# Patient Record
Sex: Female | Born: 1970 | Race: White | Hispanic: No | Smoking: Never smoker
Health system: Southern US, Community
[De-identification: ages and names within clinical notes are randomized; demographics above are authoritative.]

## PROBLEM LIST (undated history)

## (undated) DIAGNOSIS — F419 Anxiety disorder, unspecified: Secondary | ICD-10-CM

## (undated) DIAGNOSIS — E785 Hyperlipidemia, unspecified: Secondary | ICD-10-CM

## (undated) DIAGNOSIS — I10 Essential (primary) hypertension: Secondary | ICD-10-CM

## (undated) DIAGNOSIS — Z800 Family history of malignant neoplasm of digestive organs: Secondary | ICD-10-CM

## (undated) DIAGNOSIS — M1990 Unspecified osteoarthritis, unspecified site: Secondary | ICD-10-CM

## (undated) DIAGNOSIS — J45909 Unspecified asthma, uncomplicated: Secondary | ICD-10-CM

## (undated) DIAGNOSIS — C801 Malignant (primary) neoplasm, unspecified: Secondary | ICD-10-CM

## (undated) DIAGNOSIS — Z9189 Other specified personal risk factors, not elsewhere classified: Secondary | ICD-10-CM

## (undated) DIAGNOSIS — Z803 Family history of malignant neoplasm of breast: Secondary | ICD-10-CM

## (undated) DIAGNOSIS — Z808 Family history of malignant neoplasm of other organs or systems: Secondary | ICD-10-CM

## (undated) DIAGNOSIS — F32A Depression, unspecified: Secondary | ICD-10-CM

## (undated) HISTORY — DX: Family history of malignant neoplasm of breast: Z80.3

## (undated) HISTORY — PX: WISDOM TOOTH EXTRACTION: SHX21

## (undated) HISTORY — DX: Anxiety disorder, unspecified: F41.9

## (undated) HISTORY — DX: Hyperlipidemia, unspecified: E78.5

## (undated) HISTORY — DX: Family history of malignant neoplasm of other organs or systems: Z80.8

## (undated) HISTORY — DX: Family history of malignant neoplasm of digestive organs: Z80.0

---

## 1998-08-10 ENCOUNTER — Emergency Department (HOSPITAL_COMMUNITY): Admission: EM | Admit: 1998-08-10 | Discharge: 1998-08-10 | Payer: Self-pay | Admitting: Emergency Medicine

## 2000-08-21 ENCOUNTER — Encounter: Payer: Self-pay | Admitting: Emergency Medicine

## 2000-08-21 ENCOUNTER — Emergency Department (HOSPITAL_COMMUNITY): Admission: EM | Admit: 2000-08-21 | Discharge: 2000-08-21 | Payer: Self-pay | Admitting: Emergency Medicine

## 2001-11-23 ENCOUNTER — Emergency Department (HOSPITAL_COMMUNITY): Admission: EM | Admit: 2001-11-23 | Discharge: 2001-11-23 | Payer: Self-pay | Admitting: Emergency Medicine

## 2002-01-26 ENCOUNTER — Emergency Department (HOSPITAL_COMMUNITY): Admission: EM | Admit: 2002-01-26 | Discharge: 2002-01-26 | Payer: Self-pay | Admitting: Emergency Medicine

## 2002-02-05 ENCOUNTER — Emergency Department (HOSPITAL_COMMUNITY): Admission: EM | Admit: 2002-02-05 | Discharge: 2002-02-05 | Payer: Self-pay | Admitting: Emergency Medicine

## 2003-06-25 ENCOUNTER — Emergency Department (HOSPITAL_COMMUNITY): Admission: EM | Admit: 2003-06-25 | Discharge: 2003-06-25 | Payer: Self-pay | Admitting: Emergency Medicine

## 2003-12-20 ENCOUNTER — Other Ambulatory Visit: Admission: RE | Admit: 2003-12-20 | Discharge: 2003-12-20 | Payer: Self-pay | Admitting: Gynecology

## 2004-04-09 ENCOUNTER — Emergency Department (HOSPITAL_COMMUNITY): Admission: EM | Admit: 2004-04-09 | Discharge: 2004-04-09 | Payer: Self-pay | Admitting: Emergency Medicine

## 2006-01-13 ENCOUNTER — Ambulatory Visit: Payer: Self-pay | Admitting: Family Medicine

## 2006-01-15 ENCOUNTER — Encounter: Admission: RE | Admit: 2006-01-15 | Discharge: 2006-01-15 | Payer: Self-pay | Admitting: Family Medicine

## 2009-10-28 ENCOUNTER — Emergency Department (HOSPITAL_COMMUNITY): Admission: EM | Admit: 2009-10-28 | Discharge: 2009-10-28 | Payer: Self-pay | Admitting: Family Medicine

## 2010-03-23 ENCOUNTER — Ambulatory Visit: Payer: Self-pay | Admitting: Family Medicine

## 2010-04-17 ENCOUNTER — Ambulatory Visit: Payer: Self-pay | Admitting: Family Medicine

## 2010-05-21 ENCOUNTER — Ambulatory Visit: Payer: Self-pay | Admitting: Family Medicine

## 2011-04-24 ENCOUNTER — Encounter: Payer: Self-pay | Admitting: Family Medicine

## 2011-08-27 ENCOUNTER — Other Ambulatory Visit: Payer: Self-pay | Admitting: Family Medicine

## 2011-08-27 DIAGNOSIS — Z1231 Encounter for screening mammogram for malignant neoplasm of breast: Secondary | ICD-10-CM

## 2011-09-19 ENCOUNTER — Ambulatory Visit (HOSPITAL_COMMUNITY): Payer: Self-pay | Attending: Family Medicine

## 2011-12-24 ENCOUNTER — Encounter (HOSPITAL_COMMUNITY): Payer: Self-pay | Admitting: *Deleted

## 2011-12-24 ENCOUNTER — Emergency Department (HOSPITAL_COMMUNITY)
Admission: EM | Admit: 2011-12-24 | Discharge: 2011-12-24 | Disposition: A | Discharge status: Home / Self Care | Payer: Self-pay | Attending: Emergency Medicine | Admitting: Emergency Medicine

## 2011-12-24 ENCOUNTER — Emergency Department (HOSPITAL_COMMUNITY): Payer: Self-pay

## 2011-12-24 DIAGNOSIS — M79609 Pain in unspecified limb: Secondary | ICD-10-CM | POA: Insufficient documentation

## 2011-12-24 DIAGNOSIS — E785 Hyperlipidemia, unspecified: Secondary | ICD-10-CM | POA: Insufficient documentation

## 2011-12-24 DIAGNOSIS — M79603 Pain in arm, unspecified: Secondary | ICD-10-CM

## 2011-12-24 MED ORDER — IBUPROFEN 800 MG PO TABS
800.0000 mg | ORAL_TABLET | Freq: Once | ORAL | Status: AC
  Administered 2011-12-24: 800 mg via ORAL
  Filled 2011-12-24: qty 1

## 2011-12-24 MED ORDER — CYCLOBENZAPRINE HCL 10 MG PO TABS: 10.0000 mg | ORAL_TABLET | Freq: Two times a day (BID) | ORAL | Status: AC | PRN

## 2011-12-24 MED ORDER — OXYCODONE-ACETAMINOPHEN 5-325 MG PO TABS: 2.0000 | ORAL_TABLET | ORAL | Status: AC | PRN

## 2011-12-24 MED ORDER — IBUPROFEN 800 MG PO TABS: 800.0000 mg | ORAL_TABLET | Freq: Three times a day (TID) | ORAL | Status: AC

## 2011-12-24 NOTE — Discharge Instructions (Signed)
Vanessa Carr the x-ray of your arm shows no fracture or other acute process for your pain.  Follow up with the pcp of your choice from the list below or the orthopedic listed below.  Return to the ER for severe pain or weakness in the extremity.  Do not drive with the narcotic pain meds or muscle relaxor.

## 2011-12-24 NOTE — Progress Notes (Signed)
Pt states she plans to enroll in insurance offered by her employer soon

## 2011-12-24 NOTE — ED Notes (Signed)
Pt c/o R upper arm pain x2 weeks. Tingling to R fingers intermittently. Denies known injury.

## 2011-12-24 NOTE — Progress Notes (Signed)
Pt listed as self pay with no insurance coverage Pt confirms she is self pay [place] resident.  CM and [facility] community liaison spoke with her Pt offered [facility] services to assist with finding a [place] self pay provider  

## 2011-12-24 NOTE — ED Provider Notes (Signed)
History     CSN: [id number]  Arrival date & time 12/24/11  1024   First MD Initiated Contact with Patient 12/24/11 1207      Chief Complaint  Patient presents with  . Arm Pain    (Consider location/radiation/quality/duration/timing/severity/associated sxs/prior treatment) Patient is a 41 y.o. female presenting with arm injury. The history is provided by the patient. No language interpreter was used.  Arm Injury  The incident occurred more than 2 days ago. The injury mechanism was a pulled muscle. There is an injury to the right upper arm. The pain is moderate. It is unlikely that a foreign body is present. Associated symptoms include tingling. Pertinent negatives include no chest pain, no numbness, no nausea, no vomiting, no neck pain, no focal weakness, no light-headedness, no loss of consciousness, no seizures, no weakness and no memory loss. There have been no prior injuries to these areas. She is right-handed.   Pt c/o R humerus pain x 2 weeks after cleaning extensively.  Also states tingleing in fingers on the R.   Past Medical History  Diagnosis Date  . Anxiety   . Dyslipidemia     History reviewed. No pertinent past surgical history.  Family History  Problem Relation Age of Onset  . Hypertension Mother   . Asthma Father     History  Substance Use Topics  . Smoking status: Never Smoker   . Smokeless tobacco: Not on file  . Alcohol Use: Yes     socially    OB History    Grav Para Term Preterm Abortions TAB SAB Ect Mult Living                  Review of Systems  HENT: Negative for neck pain.   Cardiovascular: Negative for chest pain.  Gastrointestinal: Negative for nausea and vomiting.  Neurological: Positive for tingling. Negative for focal weakness, seizures, loss of consciousness, weakness, light-headedness and numbness.  Psychiatric/Behavioral: Negative for memory loss.    Allergies  Review of patient's allergies indicates no known allergies.  Home  Medications   Current Outpatient Rx  Name Route Sig Dispense Refill  . LEVONORGESTREL 20 MCG/24HR IU IUD Intrauterine 1 each by Intrauterine route once.      BP 136/76  Pulse 87  Temp 97.9 F (36.6 C) (Oral)  Resp 20  Ht 5\' 9"  (1.753 m)  Wt 240 lb (108.863 kg)  BMI 35.44 kg/m2  SpO2 99%  Physical Exam  Nursing note and vitals reviewed. Constitutional: She is oriented to person, place, and time. She appears well-developed and well-nourished.  HENT:  Head: Normocephalic and atraumatic.  Eyes: Conjunctivae and EOM are normal. Pupils are equal, round, and reactive to light.  Neck: Normal range of motion. Neck supple.  Cardiovascular: Normal rate, regular rhythm, normal heart sounds and intact distal pulses.  Exam reveals no gallop and no friction rub.   No murmur heard. Pulmonary/Chest: Effort normal and breath sounds normal.  Abdominal: Soft. Bowel sounds are normal.  Musculoskeletal: Normal range of motion. She exhibits tenderness. She exhibits no edema.       R humerous tenderness  Neurological: She is alert and oriented to person, place, and time. She has normal reflexes.  Skin: Skin is warm and dry.  Psychiatric: She has a normal mood and affect.    ED Course  Procedures (including critical care time)  Labs Reviewed - No data to display No results found.   No diagnosis found.    MDM  RUE  tenderness with full rom +cms.  X-ray unremarkable.  Follow up with pcp.  Return if worse. Sling, flexeril and percocetrx.  Ibuprofen.        Remi Haggard, NP 12/27/11 1719

## 2011-12-27 NOTE — ED Provider Notes (Signed)
Medical screening examination/treatment/procedure(s) were performed by non-physician practitioner and as supervising physician I was immediately available for consultation/collaboration.   Lyanne Co, MD 12/27/11 1745

## 2012-05-31 ENCOUNTER — Encounter (HOSPITAL_COMMUNITY): Payer: Self-pay | Admitting: *Deleted

## 2012-05-31 ENCOUNTER — Emergency Department (HOSPITAL_COMMUNITY): Payer: Self-pay

## 2012-05-31 ENCOUNTER — Inpatient Hospital Stay (HOSPITAL_COMMUNITY)
Admission: EM | Admit: 2012-05-31 | Discharge: 2012-06-03 | DRG: 690 | Disposition: A | Discharge status: Home / Self Care | Payer: MEDICAID | Attending: Internal Medicine | Admitting: Internal Medicine

## 2012-05-31 DIAGNOSIS — F411 Generalized anxiety disorder: Secondary | ICD-10-CM | POA: Diagnosis present

## 2012-05-31 DIAGNOSIS — R748 Abnormal levels of other serum enzymes: Secondary | ICD-10-CM

## 2012-05-31 DIAGNOSIS — E876 Hypokalemia: Secondary | ICD-10-CM | POA: Diagnosis present

## 2012-05-31 DIAGNOSIS — N12 Tubulo-interstitial nephritis, not specified as acute or chronic: Secondary | ICD-10-CM | POA: Diagnosis present

## 2012-05-31 DIAGNOSIS — Z79899 Other long term (current) drug therapy: Secondary | ICD-10-CM

## 2012-05-31 DIAGNOSIS — F329 Major depressive disorder, single episode, unspecified: Secondary | ICD-10-CM | POA: Diagnosis present

## 2012-05-31 DIAGNOSIS — B9689 Other specified bacterial agents as the cause of diseases classified elsewhere: Secondary | ICD-10-CM | POA: Diagnosis present

## 2012-05-31 DIAGNOSIS — F3289 Other specified depressive episodes: Secondary | ICD-10-CM | POA: Diagnosis present

## 2012-05-31 DIAGNOSIS — R7402 Elevation of levels of lactic acid dehydrogenase (LDH): Secondary | ICD-10-CM | POA: Diagnosis present

## 2012-05-31 DIAGNOSIS — R7401 Elevation of levels of liver transaminase levels: Secondary | ICD-10-CM | POA: Diagnosis present

## 2012-05-31 LAB — CBC WITH DIFFERENTIAL/PLATELET
Basophils Absolute: 0 10*3/uL (ref 0.0–0.1)
Basophils Relative: 0 % (ref 0–1)
Eosinophils Absolute: 0 10*3/uL (ref 0.0–0.7)
Eosinophils Relative: 0 % (ref 0–5)
HCT: 38.5 % (ref 36.0–46.0)
Hemoglobin: 13.2 g/dL (ref 12.0–15.0)
Lymphocytes Relative: 12 % (ref 12–46)
Lymphs Abs: 1.2 10*3/uL (ref 0.7–4.0)
MCH: 28.5 pg (ref 26.0–34.0)
MCHC: 34.3 g/dL (ref 30.0–36.0)
MCV: 83.2 fL (ref 78.0–100.0)
Monocytes Absolute: 1.2 10*3/uL — ABNORMAL HIGH (ref 0.1–1.0)
Monocytes Relative: 12 % (ref 3–12)
Neutro Abs: 7.5 10*3/uL (ref 1.7–7.7)
Neutrophils Relative %: 76 % (ref 43–77)
Platelets: 188 10*3/uL (ref 150–400)
RBC: 4.63 MIL/uL (ref 3.87–5.11)
RDW: 12.6 % (ref 11.5–15.5)
WBC: 9.9 10*3/uL (ref 4.0–10.5)

## 2012-05-31 LAB — LIPASE, BLOOD: Lipase: 15 U/L (ref 11–59)

## 2012-05-31 LAB — BASIC METABOLIC PANEL
BUN: 7 mg/dL (ref 6–23)
CO2: 25 mEq/L (ref 19–32)
Calcium: 9 mg/dL (ref 8.4–10.5)
Chloride: 96 mEq/L (ref 96–112)
Creatinine, Ser: 0.7 mg/dL (ref 0.50–1.10)
GFR calc Af Amer: >90 mL/min (ref >90)
GFR calc non Af Amer: >90 mL/min (ref >90)
Glucose, Bld: 101 mg/dL — ABNORMAL HIGH (ref 70–99)
Potassium: 3 mEq/L — ABNORMAL LOW (ref 3.5–5.1)
Sodium: 134 mEq/L — ABNORMAL LOW (ref 135–145)

## 2012-05-31 LAB — URINALYSIS, ROUTINE W REFLEX MICROSCOPIC
Bilirubin Urine: NEGATIVE
Glucose, UA: NEGATIVE mg/dL
Ketones, ur: 15 mg/dL — AB
Nitrite: NEGATIVE
Protein, ur: 100 mg/dL — AB
Specific Gravity, Urine: 1.019 (ref 1.005–1.030)
Urobilinogen, UA: 1 mg/dL (ref 0.0–1.0)
pH: 5.5 (ref 5.0–8.0)

## 2012-05-31 LAB — URINE MICROSCOPIC-ADD ON

## 2012-05-31 LAB — HEPATIC FUNCTION PANEL
ALT: 54 U/L — ABNORMAL HIGH (ref 0–35)
AST: 53 U/L — ABNORMAL HIGH (ref 0–37)
Albumin: 3.7 g/dL (ref 3.5–5.2)
Alkaline Phosphatase: 83 U/L (ref 39–117)
Bilirubin, Direct: 0.2 mg/dL (ref 0.0–0.3)
Indirect Bilirubin: 0.5 mg/dL (ref 0.3–0.9)
Total Bilirubin: 0.7 mg/dL (ref 0.3–1.2)
Total Protein: 7.5 g/dL (ref 6.0–8.3)

## 2012-05-31 LAB — PREGNANCY, URINE: Preg Test, Ur: NEGATIVE

## 2012-05-31 MED ORDER — SODIUM CHLORIDE 0.9 % IV SOLN
1000.0000 mL | INTRAVENOUS | Status: DC
  Administered 2012-05-31: 1000 mL via INTRAVENOUS

## 2012-05-31 MED ORDER — MORPHINE SULFATE 4 MG/ML IJ SOLN
4.0000 mg | Freq: Once | INTRAMUSCULAR | Status: AC
  Administered 2012-05-31: 4 mg via INTRAVENOUS
  Filled 2012-05-31: qty 1

## 2012-05-31 MED ORDER — MORPHINE SULFATE 2 MG/ML IJ SOLN
2.0000 mg | INTRAMUSCULAR | Status: DC | PRN
  Administered 2012-06-01: 2 mg via INTRAVENOUS
  Filled 2012-05-31: qty 1

## 2012-05-31 MED ORDER — POTASSIUM CHLORIDE CRYS ER 20 MEQ PO TBCR
40.0000 meq | EXTENDED_RELEASE_TABLET | Freq: Once | ORAL | Status: AC
  Administered 2012-05-31: 40 meq via ORAL
  Filled 2012-05-31: qty 2

## 2012-05-31 MED ORDER — DEXTROSE 5 % IV SOLN
1.0000 g | INTRAVENOUS | Status: DC
  Administered 2012-06-01 – 2012-06-02 (×2): 1 g via INTRAVENOUS
  Filled 2012-05-31 (×2): qty 10

## 2012-05-31 MED ORDER — SENNOSIDES-DOCUSATE SODIUM 8.6-50 MG PO TABS
1.0000 | ORAL_TABLET | Freq: Every evening | ORAL | Status: DC | PRN
  Administered 2012-06-01 (×2): 1 via ORAL
  Filled 2012-05-31 (×2): qty 1

## 2012-05-31 MED ORDER — ONDANSETRON HCL 4 MG/2ML IJ SOLN
4.0000 mg | Freq: Once | INTRAMUSCULAR | Status: AC
  Administered 2012-05-31: 4 mg via INTRAVENOUS
  Filled 2012-05-31: qty 2

## 2012-05-31 MED ORDER — DEXTROSE 5 % IV SOLN
1.0000 g | Freq: Once | INTRAVENOUS | Status: AC
  Administered 2012-05-31: 1 g via INTRAVENOUS
  Filled 2012-05-31: qty 10

## 2012-05-31 MED ORDER — KETOROLAC TROMETHAMINE 30 MG/ML IJ SOLN
30.0000 mg | Freq: Once | INTRAMUSCULAR | Status: AC
  Administered 2012-05-31: 30 mg via INTRAVENOUS
  Filled 2012-05-31: qty 1

## 2012-05-31 MED ORDER — ACETAMINOPHEN 325 MG PO TABS
650.0000 mg | ORAL_TABLET | Freq: Four times a day (QID) | ORAL | Status: DC | PRN
  Administered 2012-06-01 – 2012-06-02 (×2): 650 mg via ORAL
  Filled 2012-05-31 (×2): qty 2

## 2012-05-31 MED ORDER — ALUM & MAG HYDROXIDE-SIMETH 200-200-20 MG/5ML PO SUSP
30.0000 mL | Freq: Four times a day (QID) | ORAL | Status: DC | PRN
  Administered 2012-06-01: 30 mL via ORAL
  Filled 2012-05-31: qty 30

## 2012-05-31 MED ORDER — POTASSIUM CHLORIDE 10 MEQ/100ML IV SOLN
10.0000 meq | Freq: Once | INTRAVENOUS | Status: AC
  Administered 2012-05-31: 10 meq via INTRAVENOUS
  Filled 2012-05-31: qty 100

## 2012-05-31 MED ORDER — SODIUM CHLORIDE 0.9 % IV SOLN
1000.0000 mL | Freq: Once | INTRAVENOUS | Status: AC
  Administered 2012-05-31: 1000 mL via INTRAVENOUS

## 2012-05-31 MED ORDER — HYDROCODONE-ACETAMINOPHEN 5-325 MG PO TABS
1.0000 | ORAL_TABLET | ORAL | Status: DC | PRN
  Administered 2012-06-01 (×2): 2 via ORAL
  Administered 2012-06-02 (×2): 1 via ORAL
  Filled 2012-05-31 (×2): qty 1
  Filled 2012-05-31 (×2): qty 2

## 2012-05-31 MED ORDER — ZOLPIDEM TARTRATE 5 MG PO TABS
5.0000 mg | ORAL_TABLET | Freq: Every evening | ORAL | Status: DC | PRN
  Administered 2012-06-01 – 2012-06-02 (×2): 5 mg via ORAL
  Filled 2012-05-31 (×2): qty 1

## 2012-05-31 MED ORDER — POTASSIUM CHLORIDE 10 MEQ/100ML IV SOLN
10.0000 meq | INTRAVENOUS | Status: DC
  Administered 2012-05-31 (×2): 10 meq via INTRAVENOUS
  Filled 2012-05-31 (×2): qty 100

## 2012-05-31 MED ORDER — ACETAMINOPHEN 650 MG RE SUPP: 650.0000 mg | Freq: Four times a day (QID) | RECTAL | Status: DC | PRN

## 2012-05-31 MED ORDER — ENOXAPARIN SODIUM 60 MG/0.6ML ~~LOC~~ SOLN
50.0000 mg | Freq: Every day | SUBCUTANEOUS | Status: DC
  Administered 2012-05-31 – 2012-06-02 (×3): 50 mg via SUBCUTANEOUS
  Filled 2012-05-31 (×4): qty 0.6

## 2012-05-31 MED ORDER — POTASSIUM CHLORIDE IN NACL 20-0.9 MEQ/L-% IV SOLN
INTRAVENOUS | Status: DC
  Administered 2012-05-31: via INTRAVENOUS
  Filled 2012-05-31 (×3): qty 1000

## 2012-05-31 MED ORDER — ONDANSETRON HCL 4 MG PO TABS: 4.0000 mg | ORAL_TABLET | Freq: Four times a day (QID) | ORAL | Status: DC | PRN

## 2012-05-31 MED ORDER — KETOROLAC TROMETHAMINE 15 MG/ML IJ SOLN: 15.0000 mg | Freq: Three times a day (TID) | INTRAMUSCULAR | Status: DC | PRN

## 2012-05-31 MED ORDER — ONDANSETRON HCL 4 MG/2ML IJ SOLN: 4.0000 mg | Freq: Four times a day (QID) | INTRAMUSCULAR | Status: DC | PRN

## 2012-05-31 NOTE — ED Notes (Signed)
Pt began having fever and chills Monday evening.  Pt has been feeling sick off and on since then.  Yesterday pt began having severe right lower quadrant abdominal pain.  Pt denies any GU symptoms with this.  Pt is tearful.  No vomiting with this.  Pt has had some nausea, pt reports diarrhea this am.

## 2012-05-31 NOTE — ED Provider Notes (Signed)
History     CSN: [id number]  Arrival date & time 05/31/12  1749   First MD Initiated Contact with Patient 05/31/12 1805      Chief Complaint  Patient presents with  . Abdominal Pain    (Consider location/radiation/quality/duration/timing/severity/associated sxs/prior treatment) HPI  Patient reports 6 nights ago she started having fever and chills and was having sweats during the night. She reports the following day she felt better although she continued to feel weak and have by now she is. She relates the following day which was 4 days ago she had her highest fever which was 103. She denies dysuria but does have frequency. She also relates she has urgency and is having some urinary incontinence especially when she coughs. She states she started getting right flank pain yesterday and she has nausea without vomiting. She states she has a mild cough that's dry. She denies shortness of breath or rhinorrhea. She states she's never had this pain before.  PCP none  Past Medical History  Diagnosis Date  . Anxiety   . Dyslipidemia     History reviewed. No pertinent past surgical history.  Family History  Problem Relation Age of Onset  . Hypertension Mother   . Asthma Father     History  Substance Use Topics  . Smoking status: Never Smoker   . Smokeless tobacco: Not on file  . Alcohol Use: Yes     Comment: socially  divorced employed  OB History    Grav Para Term Preterm Abortions TAB SAB Ect Mult Living                  Review of Systems  All other systems reviewed and are negative.    Allergies  Review of patient's allergies indicates no known allergies.  Home Medications   Current Outpatient Rx  Name  Route  Sig  Dispense  Refill  . IBUPROFEN 200 MG PO TABS   Oral   Take 200 mg by mouth every 6 (six) hours as needed. pain         . LEVONORGESTREL 20 MCG/24HR IU IUD   Intrauterine   1 each by Intrauterine route once.           BP 155/78  Pulse 117   Temp 100.4 F (38 C) (Oral)  Resp 18  Ht 5\' 8"  (1.727 m)  Wt 210 lb (95.255 kg)  BMI 31.93 kg/m2  SpO2 100%  Vital signs normal except low-grade temp   Physical Exam  Nursing note and vitals reviewed. Constitutional: She is oriented to person, place, and time. She appears well-developed and well-nourished.  Non-toxic appearance. She does not appear ill. No distress.  HENT:  Head: Normocephalic and atraumatic.  Right Ear: External ear normal.  Left Ear: External ear normal.  Nose: Nose normal. No mucosal edema or rhinorrhea.  Mouth/Throat: Oropharynx is clear and moist and mucous membranes are normal. No dental abscesses or uvula swelling.  Eyes: Conjunctivae normal and EOM are normal. Pupils are equal, round, and reactive to light.  Neck: Normal range of motion and full passive range of motion without pain. Neck supple.  Cardiovascular: Normal rate, regular rhythm and normal heart sounds.  Exam reveals no gallop and no friction rub.   No murmur heard. Pulmonary/Chest: Effort normal and breath sounds normal. No respiratory distress. She has no wheezes. She has no rhonchi. She has no rales. She exhibits no tenderness and no crepitus.  Abdominal: Soft. Normal appearance and bowel sounds are  normal. She exhibits no distension. There is tenderness. There is no rebound and no guarding.    Genitourinary:       Patient has pain in her right upper quadrant, less so in her right flank  Musculoskeletal: Normal range of motion. She exhibits no edema and no tenderness.       Moves all extremities well.   Neurological: She is alert and oriented to person, place, and time. She has normal strength. No cranial nerve deficit.  Skin: Skin is warm, dry and intact. No rash noted. No erythema. No pallor.  Psychiatric: She has a normal mood and affect. Her speech is normal and behavior is normal. Her mood appears not anxious.    ED Course  Procedures (including critical care time)   Medications    0.9 %  sodium chloride infusion (0 mL Intravenous Stopped 05/31/12 1941)    Followed by  0.9 %  sodium chloride infusion (1000 mL Intravenous New Bag/Given 05/31/12 1941)  ibuprofen (ADVIL,MOTRIN) 200 MG tablet (not administered)  potassium chloride 10 mEq in 100 mL IVPB (10 mEq Intravenous New Bag/Given 05/31/12 2202)  morphine 4 MG/ML injection 4 mg (4 mg Intravenous Given 05/31/12 1841)  ondansetron (ZOFRAN) injection 4 mg (4 mg Intravenous Given 05/31/12 1842)  cefTRIAXone (ROCEPHIN) 1 g in dextrose 5 % 50 mL IVPB (0 g Intravenous Stopped 05/31/12 2100)  potassium chloride SA (K-DUR,KLOR-CON) CR tablet 40 mEq (40 mEq Oral Given 05/31/12 2059)  ketorolac (TORADOL) 30 MG/ML injection 30 mg (30 mg Intravenous Given 05/31/12 2203)  morphine 4 MG/ML injection 4 mg (4 mg Intravenous Given 05/31/12 2203)   Recheck pt states her pain is starting to come back. States she wants to be admitted, states she lives home alone and is afraid to go home.   22:00 Dr Joneen Roach will come see patient.   Results for orders placed during the hospital encounter of 05/31/12  CBC WITH DIFFERENTIAL      Component Value Range   WBC 9.9  4.0 - 10.5 K/uL   RBC 4.63  3.87 - 5.11 MIL/uL   Hemoglobin 13.2  12.0 - 15.0 g/dL   HCT 04.5  40.9 - 81.1 %   MCV 83.2  78.0 - 100.0 fL   MCH 28.5  26.0 - 34.0 pg   MCHC 34.3  30.0 - 36.0 g/dL   RDW 91.4  78.2 - 95.6 %   Platelets 188  150 - 400 K/uL   Neutrophils Relative 76  43 - 77 %   Neutro Abs 7.5  1.7 - 7.7 K/uL   Lymphocytes Relative 12  12 - 46 %   Lymphs Abs 1.2  0.7 - 4.0 K/uL   Monocytes Relative 12  3 - 12 %   Monocytes Absolute 1.2 (*) 0.1 - 1.0 K/uL   Eosinophils Relative 0  0 - 5 %   Eosinophils Absolute 0.0  0.0 - 0.7 K/uL   Basophils Relative 0  0 - 1 %   Basophils Absolute 0.0  0.0 - 0.1 K/uL  BASIC METABOLIC PANEL      Component Value Range   Sodium 134 (*) 135 - 145 mEq/L   Potassium 3.0 (*) 3.5 - 5.1 mEq/L   Chloride 96  96 - 112 mEq/L   CO2  25  19 - 32 mEq/L   Glucose, Bld 101 (*) 70 - 99 mg/dL   BUN 7  6 - 23 mg/dL   Creatinine, Ser 2.13  0.50 - 1.10 mg/dL  Calcium 9.0  8.4 - 10.5 mg/dL   GFR calc non Af Amer >90  >90 mL/min   GFR calc Af Amer >90  >90 mL/min  URINALYSIS, ROUTINE W REFLEX MICROSCOPIC      Component Value Range   Color, Urine AMBER (*) YELLOW   APPearance TURBID (*) CLEAR   Specific Gravity, Urine 1.019  1.005 - 1.030   pH 5.5  5.0 - 8.0   Glucose, UA NEGATIVE  NEGATIVE mg/dL   Hgb urine dipstick LARGE (*) NEGATIVE   Bilirubin Urine NEGATIVE  NEGATIVE   Ketones, ur 15 (*) NEGATIVE mg/dL   Protein, ur 161 (*) NEGATIVE mg/dL   Urobilinogen, UA 1.0  0.0 - 1.0 mg/dL   Nitrite NEGATIVE  NEGATIVE   Leukocytes, UA LARGE (*) NEGATIVE  PREGNANCY, URINE      Component Value Range   Preg Test, Ur NEGATIVE  NEGATIVE  URINE MICROSCOPIC-ADD ON      Component Value Range   Squamous Epithelial / LPF MANY (*) RARE   WBC, UA 21-50  <3 WBC/hpf   RBC / HPF 3-6  <3 RBC/hpf   Bacteria, UA MANY (*) RARE   Urine-Other MUCOUS PRESENT    HEPATIC FUNCTION PANEL      Component Value Range   Total Protein 7.5  6.0 - 8.3 g/dL   Albumin 3.7  3.5 - 5.2 g/dL   AST 53 (*) 0 - 37 U/L   ALT 54 (*) 0 - 35 U/L   Alkaline Phosphatase 83  39 - 117 U/L   Total Bilirubin 0.7  0.3 - 1.2 mg/dL   Bilirubin, Direct 0.2  0.0 - 0.3 mg/dL   Indirect Bilirubin 0.5  0.3 - 0.9 mg/dL  LIPASE, BLOOD      Component Value Range   Lipase 15  11 - 59 U/L   Laboratory interpretation all normal except hypokalemia, uti   Ct Abdomen Pelvis Wo Contrast  05/31/2012  *RADIOLOGY REPORT*  Clinical Data: Right flank pain and fever.  CT ABDOMEN AND PELVIS WITHOUT CONTRAST  Technique:  Multidetector CT imaging of the abdomen and pelvis was performed following the standard protocol without intravenous contrast.  Comparison: None.  Findings: The lung bases are clear.  There is no pleural or pericardial effusion.  No other urinary tract stones are seen on  the right or left.  There is extensive stranding about the right kidney and ureter.  The kidneys are otherwise unremarkable.  The gallbladder, spleen, adrenal glands, biliary tree and pancreas appear normal.  There is no focal liver lesion.  Marked prominence of the right lobe of the liver extending to the level of the iliac wing is likely due to a Riedel's lobe, a normal variant.  IUD is in place in the uterus.  Adnexa are unremarkable.  The stomach and small and large bowel appear normal.  The appendix is not visualized but no evidence inflammatory process is seen in the right lower quadrant.  There is no lymphadenopathy or fluid.  No focal bony abnormality is identified.  IMPRESSION: Extensive stranding about the right kidney and ureter without hydronephrosis could be due to recent stone passage but is worrisome for infectious or inflammatory process.  The study is otherwise unremarkable.   Original Report Authenticated By: Holley Dexter, M.D.     1. Pyelonephritis     Plan admission  Devoria Albe, MD, FACEP   MDM          Ward Givens, MD 05/31/12 2207

## 2012-05-31 NOTE — H&P (Signed)
PCP:   Dr. Feliberto Gottron   Chief Complaint:  Right flank pain  HPI: This is a 41 year old female who states approximately week ago she developed chills and fevers. She thought she was getting a cold so she took Alka-Seltzer cold without any improvement. This continued all week. Approximately 2 days ago she developed right flank pain that was very severe, she also has fevers and freezing chills. She has no burning on urination, she does have nausea but no vomiting, there are no blood in her urine. She finally came to ER because of the severe right flank pain. History provided by the patient was an alert and oriented.  Review of Systems:  The patient denies anorexia, fever, weight loss,, vision loss, decreased hearing, hoarseness, chest pain, syncope, dyspnea on exertion, peripheral edema, balance deficits, hemoptysis, abdominal pain, melena, hematochezia, severe indigestion/heartburn, hematuria, incontinence, genital sores, muscle weakness, suspicious skin lesions, transient blindness, difficulty walking, depression, unusual weight change, abnormal bleeding, enlarged lymph nodes, angioedema, and breast masses.  Past Medical History: Past Medical History  Diagnosis Date  . Anxiety   . Dyslipidemia    History reviewed. No pertinent past surgical history.  Medications: Prior to Admission medications   Medication Sig Start Date End Date Taking? Authorizing Provider  ibuprofen (ADVIL,MOTRIN) 200 MG tablet Take 200 mg by mouth every 6 (six) hours as needed. pain   Yes Historical Provider, MD  levonorgestrel (MIRENA) 20 MCG/24HR IUD 1 each by Intrauterine route once. 09/06/07  Yes Historical Provider, MD    Allergies:  No Known Allergies  Social History:  reports that she has never smoked. She does not have any smokeless tobacco history on file. She reports that she drinks alcohol. She reports that she does not use illicit drugs.  Family History: Family History  Problem Relation Age of Onset    . Hypertension Mother   . Asthma Father     Physical Exam: Filed Vitals:   05/31/12 1802  BP: 155/78  Pulse: 117  Temp: 100.4 F (38 C)  TempSrc: Oral  Resp: 18  Height: 5\' 8"  (1.727 m)  Weight: 95.255 kg (210 lb)  SpO2: 100%    General:  Alert and oriented times three, well developed and nourished, no acute distress Eyes: PERRLA, pink conjunctiva, no scleral icterus ENT: Moist oral mucosa, neck supple, no thyromegaly Lungs: clear to ascultation, no wheeze, no crackles, no use of accessory muscles Cardiovascular: regular rate and rhythm, no regurgitation, no gallops, no murmurs. No carotid bruits, no JVD Abdomen: soft, positive BS, non-tender, non-distended, no organomegaly, not an acute abdomen GU: not examined Neuro: CN II - XII grossly intact, sensation intact Musculoskeletal: strength 5/5 all extremities, no clubbing, cyanosis or edema, right flank tenderness to palpation Skin: no rash, no subcutaneous crepitation, no decubitus Psych: appropriate patient   Labs on Admission:   [facility] 05/31/12 1806  NA 134*  K 3.0*  CL 96  CO2 25  GLUCOSE 101*  BUN 7  CREATININE 0.70  CALCIUM 9.0  MG --  PHOS --    Basename 05/31/12 1806  AST 53*  ALT 54*  ALKPHOS 83  BILITOT 0.7  PROT 7.5  ALBUMIN 3.7    Basename 05/31/12 1806  LIPASE 15  AMYLASE --    Basename 05/31/12 1806  WBC 9.9  NEUTROABS 7.5  HGB 13.2  HCT 38.5  MCV 83.2  PLT 188     Micro Results: Results for CHRYSTELLE, BAGAN (MRN [id number]) as of 05/31/2012 22:09  Ref. Range 05/31/2012  18:27  Color, Urine Latest Range: YELLOW  AMBER (A)  APPearance Latest Range: CLEAR  TURBID (A)  Specific Gravity, Urine Latest Range: 1.005-1.030  1.019  pH Latest Range: 5.0-8.0  5.5  Glucose Latest Range: NEGATIVE mg/dL NEGATIVE  Bilirubin Urine Latest Range: NEGATIVE  NEGATIVE  Ketones, ur Latest Range: NEGATIVE mg/dL 15 (A)  Protein Latest Range: NEGATIVE mg/dL 161 (A)  Urobilinogen, UA Latest  Range: 0.0-1.0 mg/dL 1.0  Nitrite Latest Range: NEGATIVE  NEGATIVE  Leukocytes, UA Latest Range: NEGATIVE  LARGE (A)  Hgb urine dipstick Latest Range: NEGATIVE  LARGE (A)  Urine-Other No range found MUCOUS PRESENT  WBC, UA Latest Range: <3 WBC/hpf 21-50  RBC / HPF Latest Range: <3 RBC/hpf 3-6  Squamous Epithelial / LPF Latest Range: RARE  MANY (A)  Bacteria, UA Latest Range: RARE  MANY (A)  Preg Test, Ur Latest Range: NEGATIVE  NEGATIVE     Radiological Exams on Admission: Ct Abdomen Pelvis Wo Contrast  05/31/2012  *RADIOLOGY REPORT*  Clinical Data: Right flank pain and fever.  CT ABDOMEN AND PELVIS WITHOUT CONTRAST  Technique:  Multidetector CT imaging of the abdomen and pelvis was performed following the standard protocol without intravenous contrast.  Comparison: None.  Findings: The lung bases are clear.  There is no pleural or pericardial effusion.  No other urinary tract stones are seen on the right or left.  There is extensive stranding about the right kidney and ureter.  The kidneys are otherwise unremarkable.  The gallbladder, spleen, adrenal glands, biliary tree and pancreas appear normal.  There is no focal liver lesion.  Marked prominence of the right lobe of the liver extending to the level of the iliac wing is likely due to a Riedel's lobe, a normal variant.  IUD is in place in the uterus.  Adnexa are unremarkable.  The stomach and small and large bowel appear normal.  The appendix is not visualized but no evidence inflammatory process is seen in the right lower quadrant.  There is no lymphadenopathy or fluid.  No focal bony abnormality is identified.  IMPRESSION: Extensive stranding about the right kidney and ureter without hydronephrosis could be due to recent stone passage but is worrisome for infectious or inflammatory process.  The study is otherwise unremarkable.   Original Report Authenticated By: Holley Dexter, M.D.    Dg Chest 2 View  05/31/2012  *RADIOLOGY REPORT*   Clinical Data: Cough and fever.  Back pain.  CHEST - 2 VIEW  Comparison: Chest x-ray 04/09/2004.  Findings: Lung volumes are normal.  No consolidative airspace disease.  No pleural effusions.  No pneumothorax.  No pulmonary nodule or mass noted.  Pulmonary vasculature and the cardiomediastinal silhouette are within normal limits.  IMPRESSION: 1. No radiographic evidence of acute cardiopulmonary disease.   Original Report Authenticated By: Trudie Reed, M.D.     Assessment/Plan Present on Admission:  . Pyelonephritis Admit to med surg IV antibiotics ordered Pain meds ordered as needed Hypokalemia Will replete   Full code DVT prophylaxis  Ermina Oberman 05/31/2012, 10:08 PM

## 2012-06-01 ENCOUNTER — Encounter (HOSPITAL_COMMUNITY): Payer: Self-pay

## 2012-06-01 DIAGNOSIS — E876 Hypokalemia: Secondary | ICD-10-CM

## 2012-06-01 LAB — BASIC METABOLIC PANEL
BUN: 6 mg/dL (ref 6–23)
CO2: 24 mEq/L (ref 19–32)
Calcium: 7.7 mg/dL — ABNORMAL LOW (ref 8.4–10.5)
Chloride: 100 mEq/L (ref 96–112)
Creatinine, Ser: 0.61 mg/dL (ref 0.50–1.10)
GFR calc Af Amer: >90 mL/min (ref >90)
GFR calc non Af Amer: >90 mL/min (ref >90)
Glucose, Bld: 106 mg/dL — ABNORMAL HIGH (ref 70–99)
Potassium: 3.6 mEq/L (ref 3.5–5.1)
Sodium: 133 mEq/L — ABNORMAL LOW (ref 135–145)

## 2012-06-01 LAB — CBC
HCT: 33.3 % — ABNORMAL LOW (ref 36.0–46.0)
Hemoglobin: 11.5 g/dL — ABNORMAL LOW (ref 12.0–15.0)
MCH: 28.9 pg (ref 26.0–34.0)
MCHC: 34.5 g/dL (ref 30.0–36.0)
MCV: 83.7 fL (ref 78.0–100.0)
Platelets: 149 10*3/uL — ABNORMAL LOW (ref 150–400)
RBC: 3.98 MIL/uL (ref 3.87–5.11)
RDW: 12.9 % (ref 11.5–15.5)
WBC: 8.7 10*3/uL (ref 4.0–10.5)

## 2012-06-01 NOTE — Progress Notes (Signed)
Nutrition Brief Note  Patient identified on the Malnutrition Screening Tool (MST) Report  Body mass index is 34.50 kg/(m^2). Pt meets criteria for class I obesity based on current BMI.   Current diet order is regular, patient is consuming approximately 100% of meals at this time. Labs and medications reviewed. Met with pt who reports eating lightly for the past week r/t nausea however denies any changes in her weight. Pt reports her nausea has resolved and she is currently eating excellent.   No nutrition interventions warranted at this time. If nutrition issues arise, please consult RD.   Levon Hedger MS, RD, LDN [phone] Pager [phone] After Hours Pager

## 2012-06-01 NOTE — Progress Notes (Signed)
Lab called and stated that the Pt.'s blood cultures showed gram negative rods.

## 2012-06-01 NOTE — Progress Notes (Signed)
[facility] PROGRESS NOTE  Vanessa Carr LKG:[id number] DOB: 07/09/1970 DOA: 05/31/2012 PCP: Frederich Balding, RN  Assessment/Plan: 1. Acute right pyelonephritis--improved, continue empiric Rocephin. Follow-up culture. 2. Hypokalemia--resolved.  Code Status: full code Family Communication: discussed with boyfriend at bedside with patient's permission Disposition Plan: home when improved, likely 11/26.  Brendia Sacks, MD  [facility] Team 5 Pager [phone].  If 7PM-7AM, please contact night-coverage at [email or web address], password [facility] 06/01/2012, 10:18 AM  LOS: 1 day   Brief narrative: 41 year old woman with fever, chills, right flank pain. Admitted for right pyelonephritis.  Consultants:  None  Procedures:  None  Antibiotics:  Ceftriaxone 11/24 >>  HPI/Subjective: Feels better but still has right flank pain. Tolerated breakfast.  Objective: Filed Vitals:   05/31/12 1802 05/31/12 2237 05/31/12 2309 06/01/12 0625  BP: 155/78 139/76 122/83 136/85  Pulse: 117 89 106 103  Temp: 100.4 F (38 C)  98.4 F (36.9 C) 98.5 F (36.9 C)  TempSrc: Oral  Oral Oral  Resp: 18 18 18 20   Height: 5\' 8"  (1.727 m)  5\' 8"  (1.727 m)   Weight: 95.255 kg (210 lb)  102.921 kg (226 lb 14.4 oz)   SpO2: 100% 100% 99% 100%    Intake/Output Summary (Last 24 hours) at 06/01/12 1018 Last data filed at 06/01/12 0900  Gross per 24 hour  Intake 983.33 ml  Output    400 ml  Net 583.33 ml   Filed Weights   05/31/12 1802 05/31/12 2309  Weight: 95.255 kg (210 lb) 102.921 kg (226 lb 14.4 oz)    Exam:  General:  Appears calm and comfortable Cardiovascular: RRR, no m/r/g. No LE edema. Respiratory: CTA bilaterally, no w/r/r. Normal respiratory effort. Abdomen: soft, ntnd Psychiatric: grossly normal mood and affect, speech fluent and appropriate  Data Reviewed: Basic Metabolic Panel:  Lab 06/01/12 7425 05/31/12 1806  NA 133* 134*  K 3.6 3.0*  CL 100 96  CO2 24 25   GLUCOSE 106* 101*  BUN 6 7  CREATININE 0.61 0.70  CALCIUM 7.7* 9.0  MG -- --  PHOS -- --   Liver Function Tests:  Lab 05/31/12 1806  AST 53*  ALT 54*  ALKPHOS 83  BILITOT 0.7  PROT 7.5  ALBUMIN 3.7    Lab 05/31/12 1806  LIPASE 15  AMYLASE --   CBC:  Lab 06/01/12 0515 05/31/12 1806  WBC 8.7 9.9  NEUTROABS -- 7.5  HGB 11.5* 13.2  HCT 33.3* 38.5  MCV 83.7 83.2  PLT 149* 188   Studies: Ct Abdomen Pelvis Wo Contrast  05/31/2012  *RADIOLOGY REPORT*  Clinical Data: Right flank pain and fever.  CT ABDOMEN AND PELVIS WITHOUT CONTRAST  Technique:  Multidetector CT imaging of the abdomen and pelvis was performed following the standard protocol without intravenous contrast.  Comparison: None.  Findings: The lung bases are clear.  There is no pleural or pericardial effusion.  No other urinary tract stones are seen on the right or left.  There is extensive stranding about the right kidney and ureter.  The kidneys are otherwise unremarkable.  The gallbladder, spleen, adrenal glands, biliary tree and pancreas appear normal.  There is no focal liver lesion.  Marked prominence of the right lobe of the liver extending to the level of the iliac wing is likely due to a Riedel's lobe, a normal variant.  IUD is in place in the uterus.  Adnexa are unremarkable.  The stomach and small and large bowel appear normal.  The appendix is  not visualized but no evidence inflammatory process is seen in the right lower quadrant.  There is no lymphadenopathy or fluid.  No focal bony abnormality is identified.  IMPRESSION: Extensive stranding about the right kidney and ureter without hydronephrosis could be due to recent stone passage but is worrisome for infectious or inflammatory process.  The study is otherwise unremarkable.   Original Report Authenticated By: Holley Dexter, M.D.    Dg Chest 2 View  05/31/2012  *RADIOLOGY REPORT*  Clinical Data: Cough and fever.  Back pain.  CHEST - 2 VIEW  Comparison:  Chest x-ray 04/09/2004.  Findings: Lung volumes are normal.  No consolidative airspace disease.  No pleural effusions.  No pneumothorax.  No pulmonary nodule or mass noted.  Pulmonary vasculature and the cardiomediastinal silhouette are within normal limits.  IMPRESSION: 1. No radiographic evidence of acute cardiopulmonary disease.   Original Report Authenticated By: Trudie Reed, M.D.     Scheduled Meds:   . [COMPLETED] sodium chloride  1,000 mL Intravenous Once  . [COMPLETED] cefTRIAXone (ROCEPHIN)  IV  1 g Intravenous Once  . cefTRIAXone (ROCEPHIN)  IV  1 g Intravenous Q24H  . enoxaparin (LOVENOX) injection  50 mg Subcutaneous QHS  . [COMPLETED] ketorolac  30 mg Intravenous Once  . [COMPLETED]  morphine injection  4 mg Intravenous Once  . [COMPLETED]  morphine injection  4 mg Intravenous Once  . [COMPLETED] ondansetron (ZOFRAN) IV  4 mg Intravenous Once  . [COMPLETED] potassium chloride  10 mEq Intravenous Once  . [COMPLETED] potassium chloride  40 mEq Oral Once  . [DISCONTINUED] potassium chloride  10 mEq Intravenous Q1 Hr x 3   Continuous Infusions:   . 0.9 % NaCl with KCl 20 mEq / L 100 mL/hr at 05/31/12 2333  . [DISCONTINUED] sodium chloride 1,000 mL (05/31/12 1941)    Principal Problem:  *Pyelonephritis Active Problems:  Hypokalemia     Brendia Sacks, MD  [facility] Team 5 Pager [phone].  If 7PM-7AM, please contact night-coverage at [email or web address], password [email or web address] 06/01/2012, 10:18 AM  LOS: 1 day   Time spent: 20 minutes

## 2012-06-02 DIAGNOSIS — R748 Abnormal levels of other serum enzymes: Secondary | ICD-10-CM

## 2012-06-02 LAB — COMPREHENSIVE METABOLIC PANEL
ALT: 63 U/L — ABNORMAL HIGH (ref 0–35)
AST: 43 U/L — ABNORMAL HIGH (ref 0–37)
Albumin: 3.3 g/dL — ABNORMAL LOW (ref 3.5–5.2)
Alkaline Phosphatase: 95 U/L (ref 39–117)
BUN: 5 mg/dL — ABNORMAL LOW (ref 6–23)
CO2: 25 mEq/L (ref 19–32)
Calcium: 8.7 mg/dL (ref 8.4–10.5)
Chloride: 97 mEq/L (ref 96–112)
Creatinine, Ser: 0.53 mg/dL (ref 0.50–1.10)
GFR calc Af Amer: >90 mL/min (ref >90)
GFR calc non Af Amer: >90 mL/min (ref >90)
Glucose, Bld: 100 mg/dL — ABNORMAL HIGH (ref 70–99)
Potassium: 3.4 mEq/L — ABNORMAL LOW (ref 3.5–5.1)
Sodium: 133 mEq/L — ABNORMAL LOW (ref 135–145)
Total Bilirubin: 0.3 mg/dL (ref 0.3–1.2)
Total Protein: 7.2 g/dL (ref 6.0–8.3)

## 2012-06-02 LAB — URINE CULTURE
Colony Count: >=100000
Special Requests: NORMAL

## 2012-06-02 LAB — CBC
HCT: 37.2 % (ref 36.0–46.0)
Hemoglobin: 13.3 g/dL (ref 12.0–15.0)
MCH: 29.2 pg (ref 26.0–34.0)
MCHC: 35.8 g/dL (ref 30.0–36.0)
MCV: 81.6 fL (ref 78.0–100.0)
Platelets: 210 10*3/uL (ref 150–400)
RBC: 4.56 MIL/uL (ref 3.87–5.11)
RDW: 12.5 % (ref 11.5–15.5)
WBC: 6.7 10*3/uL (ref 4.0–10.5)

## 2012-06-02 MED ORDER — POLYETHYLENE GLYCOL 3350 17 G PO PACK
17.0000 g | PACK | Freq: Every day | ORAL | Status: DC
  Administered 2012-06-02: 17 g via ORAL
  Filled 2012-06-02 (×2): qty 1

## 2012-06-02 MED ORDER — POTASSIUM CHLORIDE CRYS ER 20 MEQ PO TBCR
40.0000 meq | EXTENDED_RELEASE_TABLET | Freq: Two times a day (BID) | ORAL | Status: DC
  Administered 2012-06-02: 40 meq via ORAL
  Filled 2012-06-02 (×2): qty 2

## 2012-06-02 NOTE — Progress Notes (Signed)
[facility] PROGRESS NOTE  JIMMA ORTMAN XBJ:[id number] DOB: 23-Jul-1970 DOA: 05/31/2012 PCP: No primary provider on file. Has none (no insurance)  Assessment/Plan: 1. Acute right pyelonephritis with GNR bacteremia--clinically improving, continue Rocephin pending culture results.  2. Hypokalemia--resolved. 3. Elevated AST/ALT--etiology and significance unclear. CT without focal liver lesion. Not on any causative medications. Repeat LFTs, check hepatitis panel. Suspect fatty liver.  4. No health insurance--CM referral, may need assistance with medications. Area PCP information.   Will need outpatient follow-up, follow-up on elevated transaminases/hepatitis panel.  Code Status: full code Family Communication: none present Disposition Plan: home when improved, likely 11/27.  Brendia Sacks, MD  [facility] Team 5 Pager [phone].  If 7PM-7AM, please contact night-coverage at [email or web address], password [facility] 06/02/2012, 9:48 AM  LOS: 2 days   Brief narrative: 41 year old woman with fever, chills, right flank pain. Admitted for right pyelonephritis.  Consultants:  None  Procedures:  None  Antibiotics:  Ceftriaxone 11/24 >>  HPI/Subjective: Continues to feel better, still some right flank pain. No new complaints.  Objective: Filed Vitals:   06/01/12 0625 06/01/12 1316 06/01/12 2118 06/02/12 0712  BP: 136/85 124/83 111/73 134/86  Pulse: 103 99 78 88  Temp: 98.5 F (36.9 C) 98.4 F (36.9 C) 98.6 F (37 C) 98.2 F (36.8 C)  TempSrc: Oral Oral Oral Oral  Resp: 20 18 18 20   Height:      Weight:      SpO2: 100% 100% 97% 98%    Intake/Output Summary (Last 24 hours) at 06/02/12 0948 Last data filed at 06/02/12 0845  Gross per 24 hour  Intake    240 ml  Output   1500 ml  Net  -1260 ml   Filed Weights   05/31/12 1802 05/31/12 2309  Weight: 95.255 kg (210 lb) 102.921 kg (226 lb 14.4 oz)    Exam:  General:  Appears calm and comfortable Cardiovascular:  RRR, no m/r/g.  Respiratory: CTA bilaterally, no w/r/r. Normal respiratory effort. Psychiatric: grossly normal mood and affect, speech fluent and appropriate  Data Reviewed: Basic Metabolic Panel:  Lab 06/01/12 4696 05/31/12 1806  NA 133* 134*  K 3.6 3.0*  CL 100 96  CO2 24 25  GLUCOSE 106* 101*  BUN 6 7  CREATININE 0.61 0.70  CALCIUM 7.7* 9.0  MG -- --  PHOS -- --   Liver Function Tests:  Lab 05/31/12 1806  AST 53*  ALT 54*  ALKPHOS 83  BILITOT 0.7  PROT 7.5  ALBUMIN 3.7    Lab 05/31/12 1806  LIPASE 15  AMYLASE --   CBC:  Lab 06/01/12 0515 05/31/12 1806  WBC 8.7 9.9  NEUTROABS -- 7.5  HGB 11.5* 13.2  HCT 33.3* 38.5  MCV 83.7 83.2  PLT 149* 188   Studies: Ct Abdomen Pelvis Wo Contrast  05/31/2012  *RADIOLOGY REPORT*  Clinical Data: Right flank pain and fever.  CT ABDOMEN AND PELVIS WITHOUT CONTRAST  Technique:  Multidetector CT imaging of the abdomen and pelvis was performed following the standard protocol without intravenous contrast.  Comparison: None.  Findings: The lung bases are clear.  There is no pleural or pericardial effusion.  No other urinary tract stones are seen on the right or left.  There is extensive stranding about the right kidney and ureter.  The kidneys are otherwise unremarkable.  The gallbladder, spleen, adrenal glands, biliary tree and pancreas appear normal.  There is no focal liver lesion.  Marked prominence of the right lobe of the liver extending to  the level of the iliac wing is likely due to a Riedel's lobe, a normal variant.  IUD is in place in the uterus.  Adnexa are unremarkable.  The stomach and small and large bowel appear normal.  The appendix is not visualized but no evidence inflammatory process is seen in the right lower quadrant.  There is no lymphadenopathy or fluid.  No focal bony abnormality is identified.  IMPRESSION: Extensive stranding about the right kidney and ureter without hydronephrosis could be due to recent stone  passage but is worrisome for infectious or inflammatory process.  The study is otherwise unremarkable.   Original Report Authenticated By: Holley Dexter, M.D.    Dg Chest 2 View  05/31/2012  *RADIOLOGY REPORT*  Clinical Data: Cough and fever.  Back pain.  CHEST - 2 VIEW  Comparison: Chest x-ray 04/09/2004.  Findings: Lung volumes are normal.  No consolidative airspace disease.  No pleural effusions.  No pneumothorax.  No pulmonary nodule or mass noted.  Pulmonary vasculature and the cardiomediastinal silhouette are within normal limits.  IMPRESSION: 1. No radiographic evidence of acute cardiopulmonary disease.   Original Report Authenticated By: Trudie Reed, M.D.     Scheduled Meds:    . cefTRIAXone (ROCEPHIN)  IV  1 g Intravenous Q24H  . enoxaparin (LOVENOX) injection  50 mg Subcutaneous QHS   Continuous Infusions:    . [DISCONTINUED] 0.9 % NaCl with KCl 20 mEq / L 100 mL/hr at 05/31/12 2333    Principal Problem:  *Pyelonephritis Active Problems:  Hypokalemia  Abnormal transaminases     Brendia Sacks, MD  [facility] Team 5 Pager [phone].  If 7PM-7AM, please contact night-coverage at [email or web address], password [facility] 06/02/2012, 9:48 AM  LOS: 2 days   Time spent: 15 minutes

## 2012-06-03 DIAGNOSIS — R7401 Elevation of levels of liver transaminase levels: Secondary | ICD-10-CM

## 2012-06-03 LAB — CULTURE, BLOOD (ROUTINE X 2): Special Requests: NORMAL

## 2012-06-03 LAB — HEPATITIS PANEL, ACUTE
HCV Ab: NEGATIVE
Hep A IgM: NEGATIVE
Hep B C IgM: NEGATIVE
Hepatitis B Surface Ag: NEGATIVE

## 2012-06-03 MED ORDER — BISACODYL 10 MG RE SUPP: 10.0000 mg | Freq: Once | RECTAL | Status: DC

## 2012-06-03 MED ORDER — CIPROFLOXACIN HCL 500 MG PO TABS: 500.0000 mg | ORAL_TABLET | Freq: Two times a day (BID) | ORAL | Status: DC

## 2012-06-03 MED ORDER — ONDANSETRON HCL 4 MG PO TABS: 4.0000 mg | ORAL_TABLET | Freq: Four times a day (QID) | ORAL | Status: DC | PRN

## 2012-06-03 MED ORDER — ZOLPIDEM TARTRATE 5 MG PO TABS: 5.0000 mg | ORAL_TABLET | Freq: Every evening | ORAL | Status: DC | PRN

## 2012-06-03 MED ORDER — HYDROCODONE-ACETAMINOPHEN 5-325 MG PO TABS: 1.0000 | ORAL_TABLET | ORAL | Status: DC | PRN

## 2012-06-03 MED ORDER — POTASSIUM CHLORIDE CRYS ER 20 MEQ PO TBCR: 40.0000 meq | EXTENDED_RELEASE_TABLET | Freq: Every day | ORAL | Status: DC

## 2012-06-03 NOTE — Progress Notes (Signed)
Voices understanding of d/c instructions.  No changes noted since am assessment.   

## 2012-06-03 NOTE — Discharge Summary (Signed)
Physician Discharge Summary  Vanessa Carr OZD:[id number] DOB: Oct 10, 1970 DOA: 05/31/2012  PCP: No primary provider on file.  Admit date: 05/31/2012 Discharge date: 06/03/2012  Recommendations for Outpatient Follow-up:  1. Pt will need to follow up with PCP in 2-3 weeks post discharge 2. Please obtain BMP to evaluate electrolytes and kidney function and potassium level 3. Please also check liver function tests as the elevated transaminases were noted during this hospital stay  4. Please also check CBC to evaluate Hg and Hct levels 5. Please note that pt was discharged on Ciprofloxacin based on sensitivity report and she will need to complete 12 more days upon discharge, prescription provided   Discharge Diagnoses: Right pyelonephritis Principal Problem:  *Pyelonephritis Active Problems:  Hypokalemia  Abnormal transaminases  Discharge Condition: Stable  Diet recommendation: Heart healthy diet discussed in details   History of present illness:  41 year old woman with fever, chills, right flank pain. Admitted for right pyelonephritis.  Hospital Course:  Assessment/Plan:  1. Acute right pyelonephritis with GNR bacteremia--clinically improving, continued Rocephin and since stable for discharge today will switch to oral Ciprofloxacin as sensitivity report indicated Cipro sensitive  2. Hypokalemia--mild, pt discharged on K-dur 40 meq PO QD for 5 more days, this will have to be followed up once she sees her PCP 3. Elevated AST/ALT--etiology and significance unclear. CT without focal liver lesion. Not on any causative medications. Now trending down. Will need follow up in an outpatient setting.  4. No health insurance--CM referral, may need assistance with medications. Area PCP information.   Procedures/Studies: Ct Abdomen Pelvis Wo Contrast 05/31/2012  E xtensive stranding about the right kidney and ureter without hydronephrosis could be due to recent stone passage but is worrisome  for infectious or inflammatory process.   Dg Chest 2 View 05/31/2012   1. No radiographic evidence of acute cardiopulmonary disease.     Consultations:  None  Antibiotics:  Ciprofloxacin 500 mg BID for 12 more days to complete 14 day therapy  Discharge Exam: Filed Vitals:   06/03/12 0627  BP: 113/77  Pulse: 81  Temp: 97.7 F (36.5 C)  Resp: 16   Filed Vitals:   06/02/12 0712 06/02/12 1414 06/02/12 2119 06/03/12 0627  BP: 134/86 132/88 145/90 113/77  Pulse: 88 92 101 81  Temp: 98.2 F (36.8 C) 98.3 F (36.8 C) 97.9 F (36.6 C) 97.7 F (36.5 C)  TempSrc: Oral Oral Oral Oral  Resp: 20 20 18 16   Height:    5\' 8"  (1.727 m)  Weight:    102.513 kg (226 lb)  SpO2: 98% 99% 98% 95%    General: Pt is alert, follows commands appropriately, not in acute distress Cardiovascular: Regular rate and rhythm, S1/S2 +, no murmurs, no rubs, no gallops Respiratory: Clear to auscultation bilaterally, no wheezing, no crackles, no rhonchi Abdominal: Soft, non tender, non distended, bowel sounds +, no guarding Extremities: no edema, no cyanosis, pulses palpable bilaterally DP and PT Neuro: Grossly nonfocal  Discharge Instructions  Discharge Orders    Future Orders Please Complete By Expires   Diet - low sodium heart healthy      Increase activity slowly          Medication List     As of 06/03/2012  8:47 AM    TAKE these medications         bisacodyl 10 MG suppository   Commonly known as: DULCOLAX   Place 1 suppository (10 mg total) rectally once.      HYDROcodone-acetaminophen  5-325 MG per tablet   Commonly known as: NORCO/VICODIN   Take 1-2 tablets by mouth every 4 (four) hours as needed.      ibuprofen 200 MG tablet   Commonly known as: ADVIL,MOTRIN   Take 200 mg by mouth every 6 (six) hours as needed. pain      levonorgestrel 20 MCG/24HR IUD   Commonly known as: MIRENA   1 each by Intrauterine route once.      ondansetron 4 MG tablet   Commonly known as: ZOFRAN     Take 1 tablet (4 mg total) by mouth every 6 (six) hours as needed for nausea.      potassium chloride SA 20 MEQ tablet   Commonly known as: K-DUR,KLOR-CON   Take 2 tablets (40 mEq total) by mouth daily.      zolpidem 5 MG tablet   Commonly known as: AMBIEN   Take 1 tablet (5 mg total) by mouth at bedtime as needed for sleep (insomnia).           Follow-up Information    Today to follow up. (primary care provideer in 2 weeks)           The results of significant diagnostics from this hospitalization (including imaging, microbiology, ancillary and laboratory) are listed below for reference.     Microbiology: Recent Results (from the past 240 hour(s))  CULTURE, BLOOD (ROUTINE X 2)     Status: Normal (Preliminary result)   Collection Time   05/31/12  6:17 PM      Component Value Range Status Comment   Specimen Description Blood   Final    Special Requests Normal   Final    Culture  Setup Time 06/01/2012 08:52   Final    Culture     Final    Value:        BLOOD CULTURE RECEIVED NO GROWTH TO DATE CULTURE WILL BE HELD FOR 5 DAYS BEFORE ISSUING A FINAL NEGATIVE REPORT   Report Status PENDING   Incomplete   CULTURE, BLOOD (ROUTINE X 2)     Status: Normal (Preliminary result)   Collection Time   05/31/12  6:22 PM      Component Value Range Status Comment   Specimen Description Blood   Final    Special Requests Normal   Final    Culture  Setup Time 06/01/2012 08:52   Final    Culture     Final    Value: GRAM NEGATIVE RODS     Note: Gram Stain Report Called to,Read Back By and Verified With: KAITLYN AT 2156 06/01/12 BY SNOLO   Report Status PENDING   Incomplete   URINE CULTURE     Status: Normal   Collection Time   05/31/12  6:27 PM      Component Value Range Status Comment   Specimen Description URINE, CLEAN CATCH   Final    Special Requests Normal   Final    Culture  Setup Time 06/01/2012 10:58   Final    Colony Count >=100,000 COLONIES/ML   Final    Culture  ESCHERICHIA COLI   Final    Report Status 06/02/2012 FINAL   Final    Organism ID, Bacteria ESCHERICHIA COLI   Final      Labs: Basic Metabolic Panel:  Lab 06/02/12 7846 06/01/12 0515 05/31/12 1806  NA 133* 133* 134*  K 3.4* 3.6 3.0*  CL 97 100 96  CO2 25 24 25   GLUCOSE 100* 106* 101*  BUN 5* 6 7  CREATININE 0.53 0.61 0.70  CALCIUM 8.7 7.7* 9.0  MG -- -- --  PHOS -- -- --   Liver Function Tests:  Lab 06/02/12 1030 05/31/12 1806  AST 43* 53*  ALT 63* 54*  ALKPHOS 95 83  BILITOT 0.3 0.7  PROT 7.2 7.5  ALBUMIN 3.3* 3.7    Lab 05/31/12 1806  LIPASE 15  AMYLASE --   CBC:  Lab 06/02/12 1030 06/01/12 0515 05/31/12 1806  WBC 6.7 8.7 9.9  NEUTROABS -- -- 7.5  HGB 13.3 11.5* 13.2  HCT 37.2 33.3* 38.5  MCV 81.6 83.7 83.2  PLT 210 149* 188   SIGNED: Time coordinating discharge: Over 30 minutes  Debbora Presto, MD  [facility] 06/03/2012, 8:47 AM Pager [phone]  If 7PM-7AM, please contact night-coverage [email or web address] Password [email or web address]

## 2012-06-03 NOTE — Discharge Instructions (Signed)

## 2012-06-07 LAB — CULTURE, BLOOD (ROUTINE X 2)
Culture: NO GROWTH
Special Requests: NORMAL

## 2012-08-22 ENCOUNTER — Other Ambulatory Visit: Payer: Self-pay

## 2012-10-29 ENCOUNTER — Ambulatory Visit: Payer: Self-pay | Admitting: Family Medicine

## 2013-05-13 ENCOUNTER — Other Ambulatory Visit: Payer: Self-pay

## 2013-06-10 ENCOUNTER — Emergency Department (HOSPITAL_COMMUNITY)
Admission: EM | Admit: 2013-06-10 | Discharge: 2013-06-10 | Disposition: A | Discharge status: Home / Self Care | Payer: Self-pay | Attending: Emergency Medicine | Admitting: Emergency Medicine

## 2013-06-10 ENCOUNTER — Encounter (HOSPITAL_COMMUNITY): Payer: Self-pay | Admitting: Emergency Medicine

## 2013-06-10 ENCOUNTER — Emergency Department (HOSPITAL_COMMUNITY): Payer: Self-pay

## 2013-06-10 DIAGNOSIS — R296 Repeated falls: Secondary | ICD-10-CM | POA: Insufficient documentation

## 2013-06-10 DIAGNOSIS — W19XXXA Unspecified fall, initial encounter: Secondary | ICD-10-CM

## 2013-06-10 DIAGNOSIS — R110 Nausea: Secondary | ICD-10-CM | POA: Insufficient documentation

## 2013-06-10 DIAGNOSIS — S0180XA Unspecified open wound of other part of head, initial encounter: Secondary | ICD-10-CM | POA: Insufficient documentation

## 2013-06-10 DIAGNOSIS — Z791 Long term (current) use of non-steroidal anti-inflammatories (NSAID): Secondary | ICD-10-CM | POA: Insufficient documentation

## 2013-06-10 DIAGNOSIS — S5010XA Contusion of unspecified forearm, initial encounter: Secondary | ICD-10-CM | POA: Insufficient documentation

## 2013-06-10 DIAGNOSIS — G910 Communicating hydrocephalus: Secondary | ICD-10-CM

## 2013-06-10 DIAGNOSIS — Z862 Personal history of diseases of the blood and blood-forming organs and certain disorders involving the immune mechanism: Secondary | ICD-10-CM | POA: Insufficient documentation

## 2013-06-10 DIAGNOSIS — Y9389 Activity, other specified: Secondary | ICD-10-CM | POA: Insufficient documentation

## 2013-06-10 DIAGNOSIS — Z79899 Other long term (current) drug therapy: Secondary | ICD-10-CM | POA: Insufficient documentation

## 2013-06-10 DIAGNOSIS — S40022A Contusion of left upper arm, initial encounter: Secondary | ICD-10-CM

## 2013-06-10 DIAGNOSIS — S0003XA Contusion of scalp, initial encounter: Secondary | ICD-10-CM | POA: Insufficient documentation

## 2013-06-10 DIAGNOSIS — Z8659 Personal history of other mental and behavioral disorders: Secondary | ICD-10-CM | POA: Insufficient documentation

## 2013-06-10 DIAGNOSIS — Y929 Unspecified place or not applicable: Secondary | ICD-10-CM | POA: Insufficient documentation

## 2013-06-10 DIAGNOSIS — S0990XA Unspecified injury of head, initial encounter: Secondary | ICD-10-CM

## 2013-06-10 DIAGNOSIS — S0083XA Contusion of other part of head, initial encounter: Secondary | ICD-10-CM | POA: Insufficient documentation

## 2013-06-10 DIAGNOSIS — Z8639 Personal history of other endocrine, nutritional and metabolic disease: Secondary | ICD-10-CM | POA: Insufficient documentation

## 2013-06-10 MED ORDER — ACETAMINOPHEN 500 MG PO TABS
1000.0000 mg | ORAL_TABLET | Freq: Once | ORAL | Status: AC
  Administered 2013-06-10: 1000 mg via ORAL
  Filled 2013-06-10: qty 2

## 2013-06-10 MED ORDER — IBUPROFEN 800 MG PO TABS: 800.0000 mg | ORAL_TABLET | Freq: Three times a day (TID) | ORAL | Status: AC

## 2013-06-10 NOTE — Progress Notes (Signed)
[facility] provided pt with a list of primary care resources and information on [facility].  °

## 2013-06-10 NOTE — ED Notes (Signed)
Pt alert, nad, resp even unlabored, skin pwd, c/o left arm pain after fall, onset was tow days ago, ambulates to triage

## 2013-06-10 NOTE — ED Provider Notes (Signed)
CSN: [id number]     Arrival date & time 06/10/13  1304 History  This chart was scribed for non-physician practitioner working with Lyanne Co, MD by Ashley Jacobs, ED scribe. This patient was seen in room WTR8/WTR8 and the patient's care was started at 1:48 PM.   First MD Initiated Contact with Patient 06/10/13 1315     Chief Complaint  Patient presents with  . Fall   (Consider location/radiation/quality/duration/timing/severity/associated sxs/prior Treatment) The history is provided by the patient and medical records. No language interpreter was used.   HPI Comments: Vanessa Carr is a 42 y.o. female who presents to the Emergency Department complaining of fall that occurred two days ago. Pt fell while in the bathtub, landing on her left side. She explains having jaw pain, left arm pain, neck pain, head pain, decrease appetite and nausea. The head pain is constant, throbbing and with 5/5 in severity.The left shoulder pain is worse when she moves her arm. She has an area of bruising to her posterior left forearm. She has tried Aleve and ice compression with mild relief. Pt was able to ambulate into the triage. She denies vomiting. She has a medical hx of anxiety and dyslipidemia. Pt does not smoke tobacco but does drink alcohol occasionally. 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   Past Medical History  Diagnosis Date  . Anxiety   . Dyslipidemia    History reviewed. No pertinent past surgical history. Family History  Problem Relation Age of Onset  . Hypertension Mother   . Asthma Father    History  Substance Use Topics  . Smoking status: Never Smoker   . Smokeless tobacco: Never Used  . Alcohol Use: Yes     Comment: socially   OB History   Grav Para Term Preterm Abortions TAB SAB Ect Mult Living                 Review of Systems  Constitutional: Positive for appetite change.  HENT:       Jaw pain   Gastrointestinal: Positive for nausea. Negative  for vomiting.  Musculoskeletal: Positive for arthralgias, myalgias and neck pain.  Skin:       ecchymosis to the posterior left forearm   Neurological: Positive for headaches. Negative for dizziness and syncope.  All other systems reviewed and are negative.    Allergies  Review of patient's allergies indicates no known allergies.  Home Medications   Current Outpatient Rx  Name  Route  Sig  Dispense  Refill  . naproxen sodium (ANAPROX) 220 MG tablet   Oral   Take 440 mg by mouth 2 (two) times daily with a meal.         . ibuprofen (ADVIL,MOTRIN) 800 MG tablet   Oral   Take 1 tablet (800 mg total) by mouth 3 (three) times daily.   21 tablet   0   . levonorgestrel (MIRENA) 20 MCG/24HR IUD   Intrauterine   1 each by Intrauterine route once.          BP 141/82  Pulse 84  Temp(Src) 98 F (36.7 C) (Oral)  Resp 16  Wt 180 lb (81.647 kg)  SpO2 99% Physical Exam  Nursing note and vitals reviewed. Constitutional: She is oriented to person, place, and time. She appears well-developed and well-nourished. No distress.  HENT:  Head: Normocephalic. Head is with contusion. Head is without laceration.    Right Ear: Hearing, tympanic membrane, external ear and ear  canal normal.  Left Ear: Hearing, tympanic membrane, external ear and ear canal normal.  Nose: Nose normal. No sinus tenderness or nasal deformity.  Mouth/Throat: Uvula is midline, oropharynx is clear and moist and mucous membranes are normal. No trismus in the jaw. Lacerations present.  Moderate to severe tenderness to palpitation over left occipital region Palpable hematoma 2x3 cm Tenderness over right jaw  No tend over TMJ No crepitus No dental injury No trismus   Eyes: Conjunctivae and EOM are normal. Pupils are equal, round, and reactive to light. No scleral icterus.  Neck: Normal range of motion. Neck supple.  No midline cervical tenderness No step off or crepitus   Cardiovascular: Normal rate, regular  rhythm and normal heart sounds.   Pulmonary/Chest: Effort normal and breath sounds normal. No respiratory distress. She has no wheezes. She has no rales. She exhibits no tenderness.  Abdominal: Soft. Bowel sounds are normal. She exhibits no distension and no mass. There is no tenderness. There is no rebound and no guarding.  Musculoskeletal: Normal range of motion. She exhibits tenderness. She exhibits no edema.  No edema over left forearm tenderness with ecchymosis Mild tenderness over left elbow Full ROM No crepitus    Neurological: She is alert and oriented to person, place, and time. She has normal strength. No cranial nerve deficit or sensory deficit. Gait normal. GCS eye subscore is 4. GCS verbal subscore is 5. GCS motor subscore is 6.  Skin: Skin is warm and dry. She is not diaphoretic.  Psychiatric: She has a normal mood and affect. Her behavior is normal.    ED Course  Procedures (including critical care time) DIAGNOSTIC STUDIES: Oxygen Saturation is 99% on room air, normal by my interpretation.    COORDINATION OF CARE: 1:54 PM Discussed course of care with pt . Pt understands and agrees.  Labs Review Labs Reviewed - No data to display Imaging Review Ct Head Wo Contrast  06/10/2013   CLINICAL DATA:  42 year old female with headache and nausea following fall 2 days ago.  EXAM: CT HEAD WITHOUT CONTRAST  TECHNIQUE: Contiguous axial images were obtained from the base of the skull through the vertex without intravenous contrast.  COMPARISON:  None.  FINDINGS: Mild to moderate ventriculomegaly is noted with fullness of the 3rd and 4th ventricles. No significant atrophy or small-vessel ischemic changes noted.  No other intracranial abnormalities are identified, including mass lesion or mass effect, extra-axial fluid collection, midline shift, hemorrhage, or acute infarction. The visualized bony calvarium is unremarkable.  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  No other significant abnormalities identified.   Electronically Signed   By: Laveda Abbe M.D.   On: 06/10/2013 14:50    EKG Interpretation   None       MDM   1. Fall, initial encounter   2. Head trauma   3. Communicating hydrocephalus   4. Contusion of left arm, initial encounter    Pt presenting after fall in bathtub 2 days ago. C/o left posterior head pain, right jaw pain and left arm pain. Presenting with multiple bruising to back of head and left arm.  Left arm: FROM shoulder and elbow. 3/10 pain at rest, no bony tenderness, tenderness over left forearm over bruise.  Do not believe imaging is needed at this time for left arm.    Head injury: neuro exam: normal.  Due to significant tenderness in occipital region, discussed pt with Dr. Patria Mane, CT head ordered.  Show mild to moderate  ventriculomegaly suspicious for communicating hydrocephalus but no evidence of acute injury.    Advised pt to use OTC pain medication.  Also discussed head CT findings. Advised pt to schedule non-emergent f/u with [facility] Neurology for further evaluation and possible treatment of hydrocephalus. Return precautions provided. Pt verbalized understanding and agreement with tx plan.     Junius Finner, PA-C 06/11/13 1347

## 2013-06-11 NOTE — ED Provider Notes (Signed)
Medical screening examination/treatment/procedure(s) were performed by non-physician practitioner and as supervising physician I was immediately available for consultation/collaboration.  EKG Interpretation   None         Kelsye Loomer M Georg Ang, MD 06/11/13 1508 

## 2014-11-04 ENCOUNTER — Other Ambulatory Visit: Payer: Self-pay | Admitting: Medical

## 2014-11-04 ENCOUNTER — Ambulatory Visit (INDEPENDENT_AMBULATORY_CARE_PROVIDER_SITE_OTHER): Payer: No Typology Code available for payment source | Admitting: Medical

## 2014-11-04 ENCOUNTER — Encounter: Payer: Self-pay | Admitting: Medical

## 2014-11-04 VITALS — BP 100/78 | HR 64 | Resp 16 | Wt 205.0 lb

## 2014-11-04 DIAGNOSIS — Z975 Presence of (intrauterine) contraceptive device: Secondary | ICD-10-CM | POA: Diagnosis not present

## 2014-11-04 DIAGNOSIS — F411 Generalized anxiety disorder: Secondary | ICD-10-CM

## 2014-11-04 DIAGNOSIS — R4589 Other symptoms and signs involving emotional state: Secondary | ICD-10-CM

## 2014-11-04 DIAGNOSIS — F329 Major depressive disorder, single episode, unspecified: Secondary | ICD-10-CM | POA: Diagnosis not present

## 2014-11-04 DIAGNOSIS — Z566 Other physical and mental strain related to work: Secondary | ICD-10-CM | POA: Diagnosis not present

## 2014-11-04 MED ORDER — ALPRAZOLAM 0.25 MG PO TABS: 0.2500 mg | ORAL_TABLET | Freq: Two times a day (BID) | ORAL | Status: DC | PRN

## 2014-11-04 MED ORDER — CITALOPRAM HYDROBROMIDE 20 MG PO TABS: 20.0000 mg | ORAL_TABLET | Freq: Every day | ORAL | Status: DC

## 2014-11-04 NOTE — Patient Instructions (Signed)
Counseling services   [facility] [phone] office [email or web address] [address], [place], [place] [address]  Laura Atkinson, therapist  Erik Nelson, MA, clinical psychologist  Cognitive-Behavior Therapy; Mood Disorders; Anxiety Disorders; adult and child ADHD; Family Therapy; Stress Management; personal growth, and Marital Therapy.    Dennis McKnight Ph.D., clinical psychologist Cognitive-Behavior Therapy; Mood Disorders; Anxiety Disorders; Stress     Management   [facility] [address], [place], [place] [address] [phone]   [facility] Desiree Wilkinson, psychotherapist [address] , [place] [address] [phone]   

## 2014-11-04 NOTE — Progress Notes (Signed)
Subjective Here as a returning patient for stress and anxiety.   For months having worse trouble focusing at work, feels stressed about everything, feels down in mood.  During divorce 4 years was on medication from here for depression and anxiety.   For the last year things have been bad.    Her anxiety is so bad, she doesn't want to leave the house.   Been taking BP at work and it has been elevated.   Having some headaches.  Had lost weight 2 years ago, was down to 173lb, but has put on 30lb in the last several months, may have been stress eating.   Lives at home with boyfriend of 3 years, they get along fine.   Has 25yo, 42yo, and 19yo children.  Children doing well, relationship with them is fine.   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   Feels stressed at work, stressed with bills.  Been working at [employer] for 20 years.   Has new boss that has been there a year.  Feels a lot of stress from regulations handed down by the state.   Stresses over things in general.  Has never seen a counselor for this.  Exercise - goes to [employer], walks.  Son has ADD, so she has wondered if she had ADD.   Hasn't checked BP in a few weeks, wsa checking every day.    Wondered about menopause.  Has IUD, so no regular period.  Had pap smear with free screening [facility] 3 mo ago, and plans to get IUD replaced soon as it is 42 years old.   No hx/o mom or sister with early menopause.  Sister who is 51yo is going through menopause now.   Worries about things all the time. No other aggravating or relieving factors.  No other complaint.  Objective: BP 100/78 mmHg  Pulse 64  Resp 16  Wt 205 lb (92.987 kg)  My BP measurement personally was 128/86, large cuff right arm  General appearance: alert, no distress, WD/WN Neck: supple, no lymphadenopathy, no thyromegaly, no masses Heart: RRR, normal S1, S2, no murmurs Lungs: CTA bilaterally, no wheezes, rhonchi, or  rales Extremities: no edema, no cyanosis, no clubbing Pulses: 2+ symmetric, upper and lower extremities, normal cap refill Neurological: alert, oriented x 3, CN2-12 intact, strength normal upper extremities and lower extremities, sensation normal throughout, DTRs 2+ throughout, no cerebellar signs, gait normal Psychiatric: anxious appearing, tearful at times   Assessment: Encounter Diagnoses  Name Primary?  Marland Kitchen. Anxiety state Yes  . Depressed mood   . Work-related stress      Plan: PHQ-9 questionnaire with score of 17 today. Mood disorder questionnaire today negative.  General Anxiety questionnaire score of 12.  Discussed concerns, ways to deal with anxiety, stress, counseled on exercise journaling, healthy diet, financial counseling, working with her boss to work through the things that get her stressed at work.    Recommended she establish with counseling.  Gave list of providers.  Begin trial of Citalopram.  Discussed risks/benefits of medication.   Also can use Xanax short term prn for panic episodes.   advised she probably doesn't have menopause at this time, but her Toney Reilmirena is going out of date and there could be some hormonal fluctuation.  She has f/u planned with gyn for IUD replacement.  F/u 2-3 wk, sooner prn.  Return soon for physical.

## 2014-11-30 ENCOUNTER — Telehealth: Payer: Self-pay | Admitting: Medical

## 2014-11-30 NOTE — Telephone Encounter (Signed)
Needs OV, see last office note plan.

## 2014-11-30 NOTE — Telephone Encounter (Signed)
Left message for pt to call. Needs ov per shane.

## 2014-11-30 NOTE — Telephone Encounter (Signed)
Pt called for refills of xanax. Pt can be reached at [phone].

## 2015-09-08 ENCOUNTER — Emergency Department (HOSPITAL_COMMUNITY)
Admission: EM | Admit: 2015-09-08 | Discharge: 2015-09-08 | Disposition: A | Discharge status: Home / Self Care | Payer: Self-pay | Attending: Emergency Medicine | Admitting: Emergency Medicine

## 2015-09-08 ENCOUNTER — Encounter (HOSPITAL_COMMUNITY): Payer: Self-pay

## 2015-09-08 ENCOUNTER — Emergency Department (HOSPITAL_COMMUNITY): Payer: Self-pay

## 2015-09-08 DIAGNOSIS — R1110 Vomiting, unspecified: Secondary | ICD-10-CM | POA: Insufficient documentation

## 2015-09-08 DIAGNOSIS — R509 Fever, unspecified: Secondary | ICD-10-CM | POA: Insufficient documentation

## 2015-09-08 LAB — COMPREHENSIVE METABOLIC PANEL
ALT: 30 U/L (ref 14–54)
AST: 33 U/L (ref 15–41)
Albumin: 4.1 g/dL (ref 3.5–5.0)
Alkaline Phosphatase: 50 U/L (ref 38–126)
Anion gap: 12 (ref 5–15)
BUN: 13 mg/dL (ref 6–20)
CO2: 24 mmol/L (ref 22–32)
Calcium: 8.6 mg/dL — ABNORMAL LOW (ref 8.9–10.3)
Chloride: 100 mmol/L — ABNORMAL LOW (ref 101–111)
Creatinine, Ser: 0.62 mg/dL (ref 0.44–1.00)
GFR calc Af Amer: >60 mL/min (ref >60)
GFR calc non Af Amer: >60 mL/min (ref >60)
Glucose, Bld: 122 mg/dL — ABNORMAL HIGH (ref 65–99)
Potassium: 3.6 mmol/L (ref 3.5–5.1)
Sodium: 136 mmol/L (ref 135–145)
Total Bilirubin: 1 mg/dL (ref 0.3–1.2)
Total Protein: 7.1 g/dL (ref 6.5–8.1)

## 2015-09-08 LAB — CBC
HCT: 39.3 % (ref 36.0–46.0)
Hemoglobin: 13.2 g/dL (ref 12.0–15.0)
MCH: 30.1 pg (ref 26.0–34.0)
MCHC: 33.6 g/dL (ref 30.0–36.0)
MCV: 89.7 fL (ref 78.0–100.0)
Platelets: 86 10*3/uL — ABNORMAL LOW (ref 150–400)
RBC: 4.38 MIL/uL (ref 3.87–5.11)
RDW: 12.6 % (ref 11.5–15.5)
WBC: 8.8 10*3/uL (ref 4.0–10.5)

## 2015-09-08 LAB — LIPASE, BLOOD: Lipase: 21 U/L (ref 11–51)

## 2015-09-08 NOTE — ED Notes (Signed)
Pt presents with c/o fever and vomiting. Pt reports that she thought she was fighting off a bladder infection and she was drinking cranberry juice and taking cranberry pills and then yesterday she started running a fever. Pt reports that today she started vomiting.

## 2015-09-08 NOTE — ED Notes (Signed)
Called pt from lobby for a room in the back. Pt met this RN at the lobby doors and reported that she was going to leave.

## 2016-08-18 IMAGING — CR DG CHEST 2V
2 series · 2 of 2 positions shown · non-contrast
Comparison: 05/31/2012

CLINICAL DATA: Pt presents with c/o fever and vomiting. Pt reports
that she thought she was fighting off a bladder infection and she
was drinking cranberry juice and taking cranberry pills and then
yesterday she started running a fever. Pt reports that today she
started vomiting. Non smoker. No current chest complaints.

EXAM:
CHEST  2 VIEW

[w chest pa]
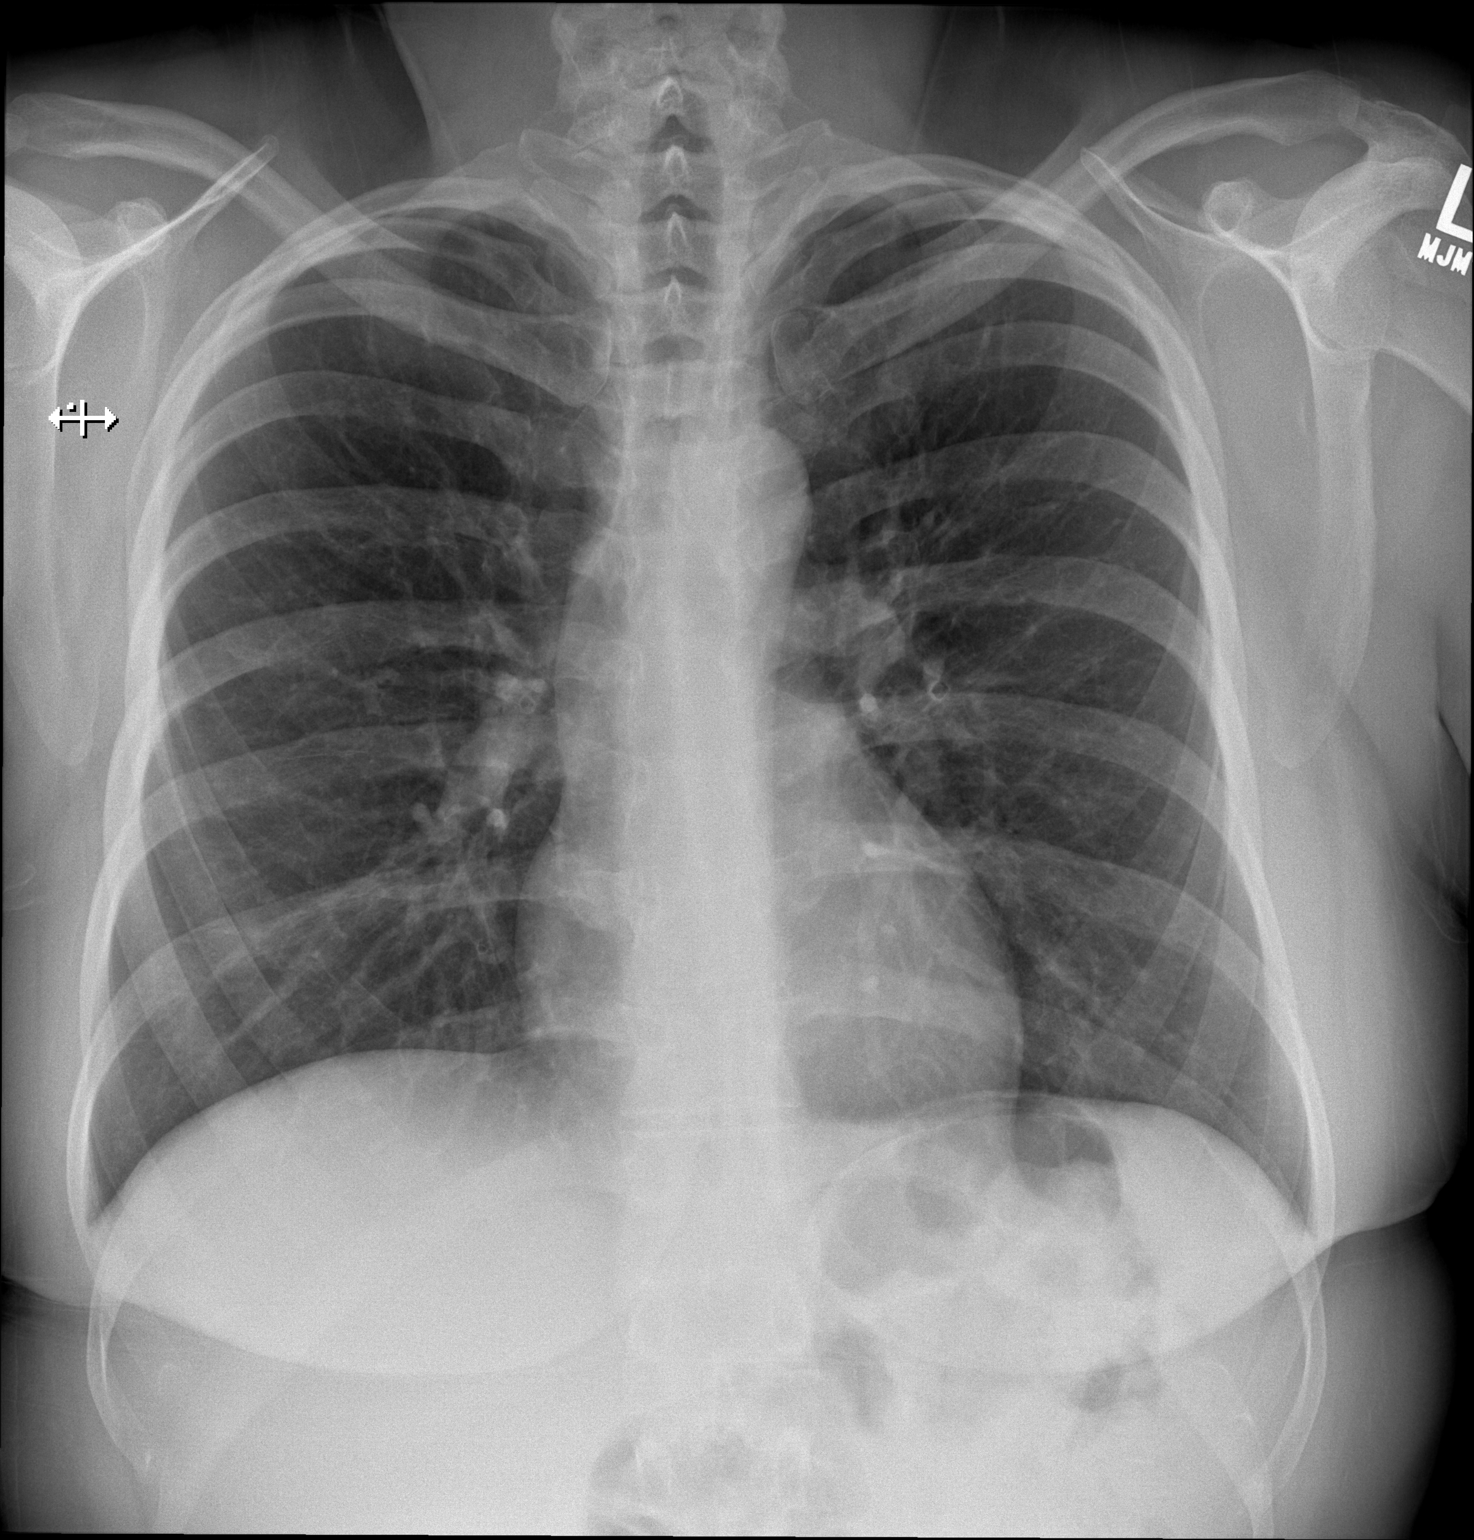

[w chest lat]
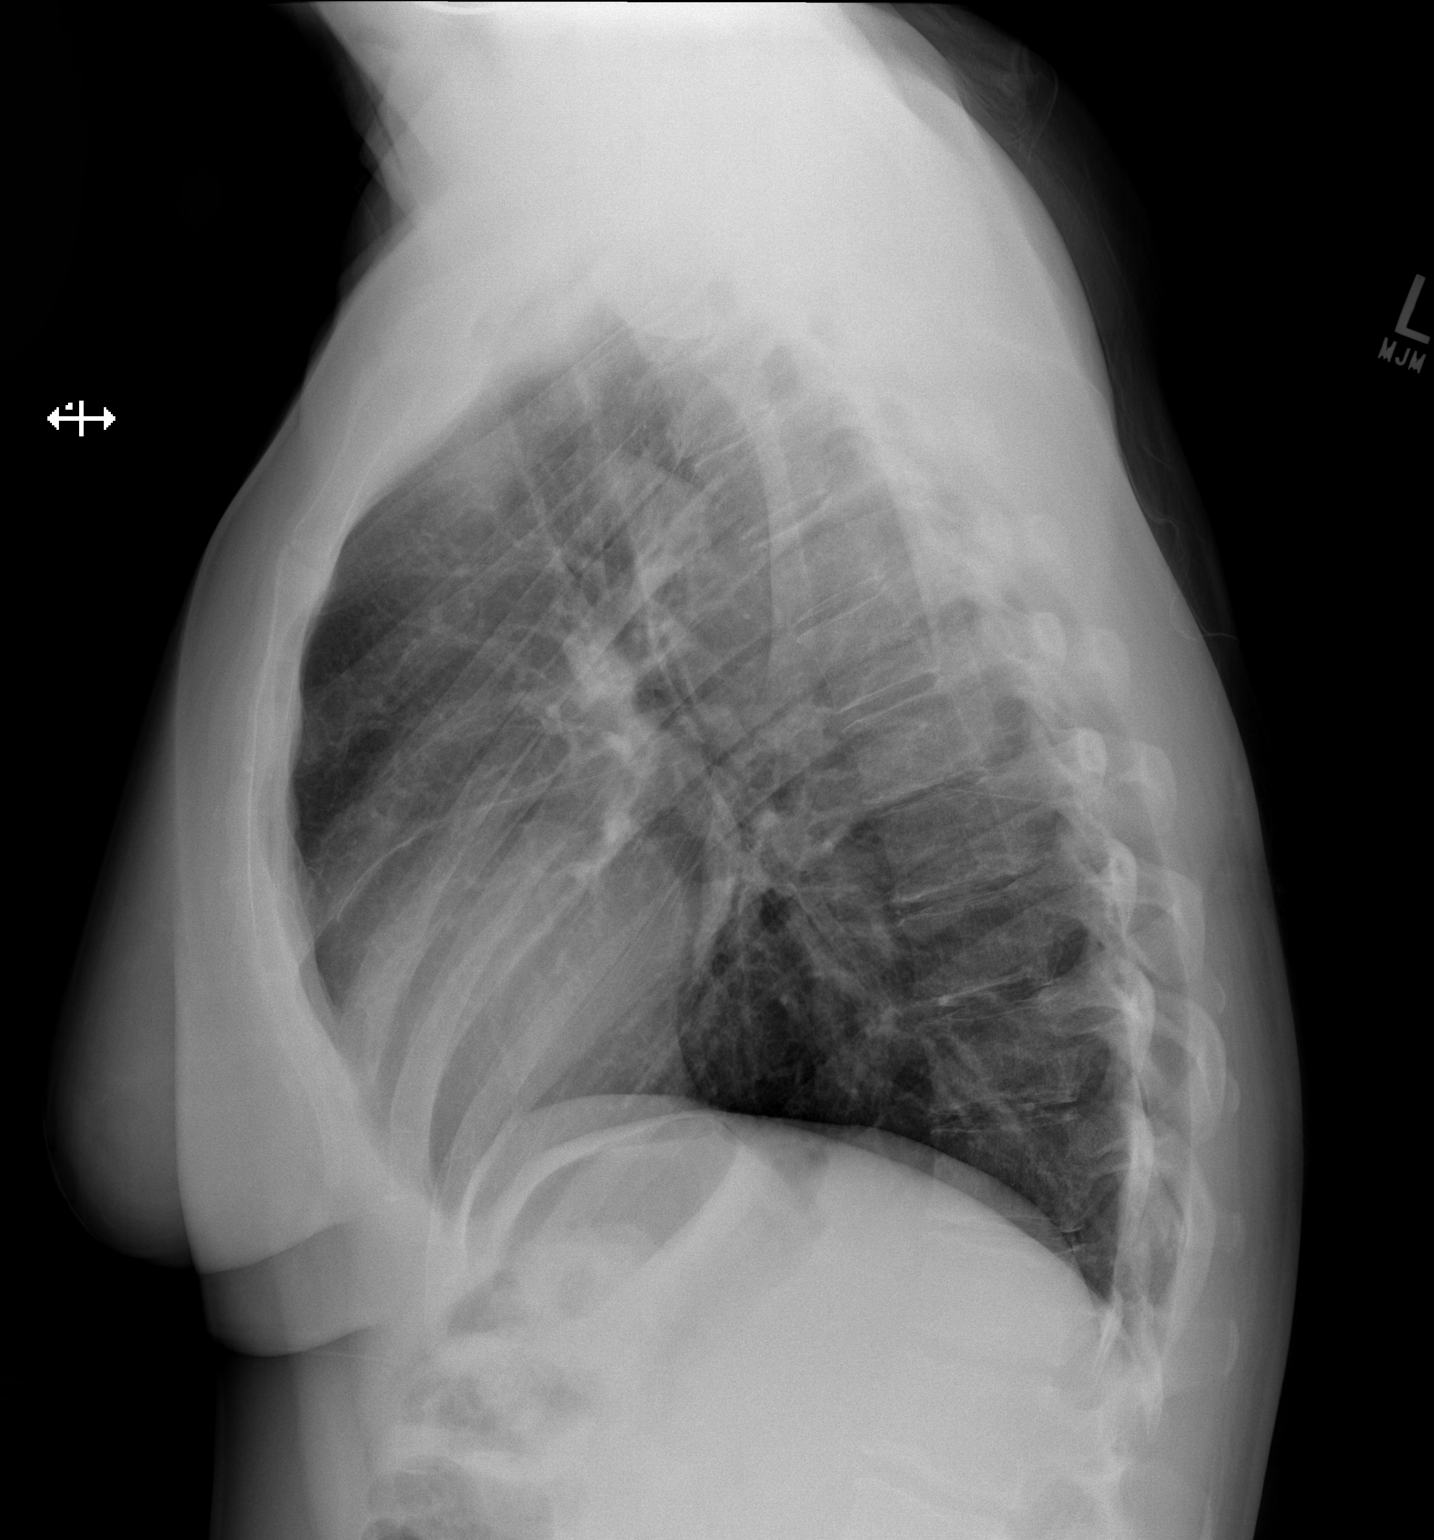

[2 of 2 positions shown; findings below may reference images not displayed]

FINDINGS: The heart size and mediastinal contours are within normal limits.
Both lungs are clear. No pleural effusion or pneumothorax. The
visualized skeletal structures are unremarkable.
IMPRESSION: Normal chest radiographs.

## 2019-10-23 ENCOUNTER — Ambulatory Visit: Payer: Self-pay | Attending: Internal Medicine

## 2019-10-23 DIAGNOSIS — Z23 Encounter for immunization: Secondary | ICD-10-CM

## 2019-10-23 NOTE — Progress Notes (Signed)
   Covid-19 Vaccination Clinic  Name:  KORBYN VANES    MRN: [id number] DOB: 06-May-1971  10/23/2019  Ms. Yamin was observed post Covid-19 immunization for 15 minutes without incident. She was provided with Vaccine Information Sheet and instruction to access the V-Safe system.   Ms. Kawasaki was instructed to call 911 with any severe reactions post vaccine: Marland Kitchen Difficulty breathing  . Swelling of face and throat  . A fast heartbeat  . A bad rash all over body  . Dizziness and weakness   Immunizations Administered    Name Date Dose VIS Date Route   Pfizer COVID-19 Vaccine 10/23/2019  9:32 AM 0.3 mL 06/18/2019 Intramuscular   Manufacturer: [employer], [employer]   Lot: [id number]   NDC: [address]-4

## 2019-11-16 ENCOUNTER — Ambulatory Visit: Payer: Self-pay | Attending: Internal Medicine

## 2019-11-20 ENCOUNTER — Ambulatory Visit: Payer: Self-pay | Attending: Internal Medicine

## 2019-11-20 DIAGNOSIS — Z23 Encounter for immunization: Secondary | ICD-10-CM

## 2019-11-20 NOTE — Progress Notes (Signed)
   Covid-19 Vaccination Clinic  Name:  Vanessa Carr    MRN: [id number] DOB: 1971-03-01  11/20/2019  [name] was observed post Covid-19 immunization for 15 minutes without incident. She was provided with Vaccine Information Sheet and instruction to access the V-Safe system.   [name] was instructed to call 911 with any severe reactions post vaccine: Marland Kitchen Difficulty breathing  . Swelling of face and throat  . A fast heartbeat  . A bad rash all over body  . Dizziness and weakness   Immunizations Administered    Name Date Dose VIS Date Route   Pfizer COVID-19 Vaccine 11/20/2019  9:42 AM 0.3 mL 09/01/2018 Intramuscular   Manufacturer: [employer], [employer]   Lot: [id number]   NDC: [address]-7

## 2021-12-17 ENCOUNTER — Other Ambulatory Visit: Payer: Self-pay | Admitting: Nurse Practitioner

## 2021-12-17 ENCOUNTER — Other Ambulatory Visit (HOSPITAL_COMMUNITY)
Admission: RE | Admit: 2021-12-17 | Discharge: 2021-12-17 | Disposition: A | Discharge status: Home / Self Care | Payer: 59 | Source: Ambulatory Visit | Attending: Nurse Practitioner | Admitting: Nurse Practitioner

## 2021-12-17 DIAGNOSIS — Z124 Encounter for screening for malignant neoplasm of cervix: Secondary | ICD-10-CM | POA: Diagnosis present

## 2021-12-20 LAB — CYTOLOGY - PAP
Comment: NEGATIVE
Diagnosis: NEGATIVE
High risk HPV: NEGATIVE

## 2023-01-31 ENCOUNTER — Ambulatory Visit: Payer: Self-pay | Admitting: Surgical

## 2023-01-31 ENCOUNTER — Ambulatory Visit: Payer: No Typology Code available for payment source | Admitting: Surgical

## 2023-03-19 ENCOUNTER — Other Ambulatory Visit: Payer: Self-pay | Admitting: Family Medicine

## 2023-03-19 DIAGNOSIS — Z1231 Encounter for screening mammogram for malignant neoplasm of breast: Secondary | ICD-10-CM

## 2023-04-09 ENCOUNTER — Ambulatory Visit: Payer: Medicaid Other

## 2023-05-08 ENCOUNTER — Ambulatory Visit: Payer: Medicaid Other

## 2023-05-19 ENCOUNTER — Ambulatory Visit
Admission: RE | Admit: 2023-05-19 | Discharge: 2023-05-19 | Disposition: A | Discharge status: Home / Self Care | Payer: Medicaid Other | Source: Ambulatory Visit | Attending: Family Medicine | Admitting: Family Medicine

## 2023-05-19 ENCOUNTER — Ambulatory Visit: Payer: Medicaid Other

## 2023-05-19 DIAGNOSIS — Z1231 Encounter for screening mammogram for malignant neoplasm of breast: Secondary | ICD-10-CM

## 2023-05-22 ENCOUNTER — Other Ambulatory Visit: Payer: Self-pay | Admitting: Family Medicine

## 2023-05-22 DIAGNOSIS — R928 Other abnormal and inconclusive findings on diagnostic imaging of breast: Secondary | ICD-10-CM

## 2023-05-30 ENCOUNTER — Other Ambulatory Visit (HOSPITAL_BASED_OUTPATIENT_CLINIC_OR_DEPARTMENT_OTHER): Payer: Self-pay | Admitting: Family Medicine

## 2023-05-30 DIAGNOSIS — M5416 Radiculopathy, lumbar region: Secondary | ICD-10-CM

## 2023-06-10 ENCOUNTER — Other Ambulatory Visit (HOSPITAL_BASED_OUTPATIENT_CLINIC_OR_DEPARTMENT_OTHER): Payer: Self-pay | Admitting: Physician Assistant

## 2023-06-10 DIAGNOSIS — Z1231 Encounter for screening mammogram for malignant neoplasm of breast: Secondary | ICD-10-CM

## 2023-06-13 ENCOUNTER — Inpatient Hospital Stay (HOSPITAL_BASED_OUTPATIENT_CLINIC_OR_DEPARTMENT_OTHER): Admission: RE | Admit: 2023-06-13 | Payer: Medicaid Other | Source: Ambulatory Visit | Admitting: Radiology

## 2023-06-14 ENCOUNTER — Ambulatory Visit
Admission: RE | Admit: 2023-06-14 | Discharge: 2023-06-14 | Disposition: A | Discharge status: Home / Self Care | Payer: Medicaid Other | Source: Ambulatory Visit | Attending: Family Medicine | Admitting: Family Medicine

## 2023-06-14 ENCOUNTER — Other Ambulatory Visit: Payer: Self-pay | Admitting: Family Medicine

## 2023-06-14 DIAGNOSIS — R928 Other abnormal and inconclusive findings on diagnostic imaging of breast: Secondary | ICD-10-CM

## 2023-06-14 DIAGNOSIS — N6320 Unspecified lump in the left breast, unspecified quadrant: Secondary | ICD-10-CM

## 2023-06-20 ENCOUNTER — Ambulatory Visit
Admission: RE | Admit: 2023-06-20 | Discharge: 2023-06-20 | Disposition: A | Discharge status: Home / Self Care | Payer: Medicaid Other | Source: Ambulatory Visit | Attending: Family Medicine | Admitting: Family Medicine

## 2023-06-20 ENCOUNTER — Ambulatory Visit (HOSPITAL_BASED_OUTPATIENT_CLINIC_OR_DEPARTMENT_OTHER): Payer: Medicaid Other

## 2023-06-20 DIAGNOSIS — R928 Other abnormal and inconclusive findings on diagnostic imaging of breast: Secondary | ICD-10-CM

## 2023-06-20 DIAGNOSIS — N6320 Unspecified lump in the left breast, unspecified quadrant: Secondary | ICD-10-CM

## 2023-06-20 HISTORY — PX: BREAST BIOPSY: SHX20

## 2023-06-23 LAB — SURGICAL PATHOLOGY

## 2023-06-27 ENCOUNTER — Ambulatory Visit: Payer: Self-pay | Admitting: General Surgery

## 2023-06-27 DIAGNOSIS — C50412 Malignant neoplasm of upper-outer quadrant of left female breast: Secondary | ICD-10-CM

## 2023-06-27 MED ORDER — KETOROLAC TROMETHAMINE 15 MG/ML IJ SOLN: 15.0000 mg | INTRAMUSCULAR | Status: AC

## 2023-06-30 ENCOUNTER — Other Ambulatory Visit: Payer: Self-pay | Admitting: General Surgery

## 2023-06-30 DIAGNOSIS — Z170 Estrogen receptor positive status [ER+]: Secondary | ICD-10-CM

## 2023-07-03 NOTE — Progress Notes (Signed)
Radiation Oncology         [phone] ________________________________  Initial Outpatient Consultation  Name: Vanessa Carr MRN: [id number]  Date: 07/04/2023  DOB: [birth year]  EX:[facility], [place], [place]  Griselda Miner, MD   REFERRING PHYSICIAN: Chevis Pretty III, MD  DIAGNOSIS:    ICD-10-CM   1. 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 female who presented with a left breast abnormality on the following imaging: bilateral screening mammogram on the date of 05/19/23. No symptoms, if any, were reported at that time. She subsequently presented for a left breast diagnostic mammogram and ultrasound on 06/14/23 which showed a suspicious 8 mm mass in the 12 o'clock left breast located 7 cmfn, with associated microcalcifications centered over the mass, collectively spanning 3 cm.   Biopsy of the 12 o'clock left breast on date of 06/20/23 showed grade 2 invasive ductal carcinoma measuring 1 cm in the greatest linear extent of the sample, with intermediate grade DCIS and necrosis.  ER status: 95% positive and PR status 95% positive, both with strong staining intensity; Proliferation marker Ki67 at 15%; Her2 status negative; Grade 2. No lymph nodes were examined.   She was recently seen in consultation by Dr. Carolynne Edouard and she has  opted to proceed with a left breast lumpectomy and left axillary SLN evaluation. Her procedure is currently scheduled for 07/10/22.   Of note: She does have a family history of breast cancer in her mother and a maternal aunt.   Lymphedema issues, if any:  no    Pain issues, if any:  no   SAFETY ISSUES: Prior radiation? no Pacemaker/ICD? no Possible current pregnancy?  patient has mirena IUD removed September 2024. Has been taking birth control pills.  Is the patient on methotrexate? no   PREVIOUS RADIATION THERAPY: No  PAST MEDICAL HISTORY:  has a past medical history of Anxiety and Dyslipidemia.    PAST SURGICAL HISTORY: Past Surgical History:  Procedure Laterality Date   BREAST BIOPSY Left 06/20/2023   [place] LT BREAST BX W LOC DEV 1ST LESION IMG BX SPEC US GUIDE 06/20/2023 [facility] MAMMOGRAPHY    FAMILY HISTORY: family history includes Asthma in her father; Breast cancer (age of onset: 32) in her sister; Breast cancer (age of onset: 3) in her maternal aunt and mother; Cancer in her father, maternal aunt, mother, and sister; Hypertension in her mother.  SOCIAL HISTORY:  reports that she has never smoked. She has never used smokeless tobacco. She reports current alcohol use. She reports that she does not use drugs.  ALLERGIES: Patient has no known allergies.  MEDICATIONS:  Current Outpatient Medications  Medication Sig Dispense Refill   albuterol (VENTOLIN HFA) 108 (90 Base) MCG/ACT inhaler 2 puffs every 6 (six) hours as needed for wheezing.     FEROSUL 325 (65  Fe) MG tablet Take 325 mg by mouth every other day.     ibuprofen (ADVIL,MOTRIN) 800 MG tablet Take 1 tablet (800 mg total) by mouth 3 (three) times daily. 21 tablet 0   lisinopril-hydrochlorothiazide (ZESTORETIC) 20-25 MG tablet Take 1 tablet by mouth daily.     LORazepam (ATIVAN) 0.5 MG tablet 0.5 mg as needed.     meloxicam (MOBIC) 15 MG tablet Take 15 mg by mouth as needed.     naproxen sodium (ANAPROX) 220 MG tablet  Take 440 mg by mouth 2 (two) times daily with a meal.     polyethylene glycol powder (GLYCOLAX/MIRALAX) 17 GM/SCOOP powder Take 17 g by mouth daily.     rOPINIRole (REQUIP) 3 MG tablet Take 3 mg by mouth at bedtime.     traMADol (ULTRAM) 50 MG tablet Take 50 mg by mouth as needed.     WEGOVY 0.25 MG/0.5ML SOAJ Inject 0.25 mg into the skin once a week.     citalopram (CELEXA) 20 MG tablet Take 1 tablet (20 mg total) by mouth daily. (Patient not taking: Reported on 07/04/2023) 30 tablet 1   Diclofenac Sodium 3 % GEL Apply 1 Application topically 2 (two) times daily.     levonorgestrel (MIRENA) 20 MCG/24HR IUD 1 each by Intrauterine route once. (Patient not taking: Reported on 07/04/2023)     No current facility-administered medications for this encounter.    REVIEW OF SYSTEMS: As above in HPI.   PHYSICAL EXAM:  height is 5\' 8"  (1.727 m) and weight is 248 lb 8 oz (112.7 kg). Her oral temperature is 98 F (36.7 C). Her blood pressure is 99/61 and her pulse is 85. Her respiration is 17 and oxygen saturation is 100%.   General: Alert and oriented, in no acute distress HEENT: Head is normocephalic. Extraocular movements are intact.   Neck: Neck is supple Heart: Regular in rate and rhythm with no murmurs, rubs, or gallops. Chest: Clear to auscultation bilaterally, with no rhonchi, wheezes, or rales. Abdomen: Soft, nontender, nondistended, with no rigidity or guarding. Extremities: No cyanosis or edema in UEs. Skin: No concerning lesions. Musculoskeletal: symmetric strength and muscle tone throughout. Neurologic: Cranial nerves II through XII are grossly intact. No obvious focalities. Speech is fluent. Coordination is intact. Psychiatric: Judgment and insight are intact. Affect is appropriate. Breasts: Lt Breast biopsy changes, 1:00 . No other palpable masses appreciated in the breasts or axillae b/l .   ECOG = 0  0 - Asymptomatic (Fully active, able to carry on all predisease activities  without restriction)  1 - Symptomatic but completely ambulatory (Restricted in physically strenuous activity but ambulatory and able to carry out work of a light or sedentary nature. For example, light housework, office work)  2 - Symptomatic, <50% in bed during the day (Ambulatory and capable of all self care but unable to carry out any work activities. Up and about more than 50% of waking hours)  3 - Symptomatic, >50% in bed, but not bedbound (Capable of only limited self-care, confined to bed or chair 50% or more of waking hours)  4 - Bedbound (Completely disabled. Cannot carry on any self-care. Totally confined to bed or chair)  5 - Death   Santiago Glad MM, Creech RH, Tormey DC, et al. [id number]). "Toxicity and response criteria of the [facility]". Am. Evlyn Clines. Oncol. 5 (6): 649-55   LABORATORY DATA:  Lab Results  Component Value Date   WBC 8.8 09/08/2015   HGB 13.2 09/08/2015  HCT 39.3 09/08/2015   MCV 89.7 09/08/2015   PLT 86 (L) 09/08/2015   CMP     Component Value Date/Time   NA 136 09/08/2015 1411   K 3.6 09/08/2015 1411   CL 100 (L) 09/08/2015 1411   CO2 24 09/08/2015 1411   GLUCOSE 122 (H) 09/08/2015 1411   BUN 13 09/08/2015 1411   CREATININE 0.62 09/08/2015 1411   CALCIUM 8.6 (L) 09/08/2015 1411   PROT 7.1 09/08/2015 1411   ALBUMIN 4.1 09/08/2015 1411   AST 33 09/08/2015 1411   ALT 30 09/08/2015 1411   ALKPHOS 50 09/08/2015 1411   BILITOT 1.0 09/08/2015 1411   GFRNONAA >60 09/08/2015 1411   GFRAA >60 09/08/2015 1411         RADIOGRAPHY: [place] LT BREAST BX W LOC DEV 1ST LESION IMG BX SPEC US GUIDE Addendum Date: 06/24/2023 ADDENDUM REPORT: 06/24/2023 12:05 ADDENDUM: Pathology revealed GRADE II INVASIVE DUCTAL CARCINOMA, DUCTAL CARCINOMA IN SITU, CRIBRIFORM, INTERMEDIATE NUCLEAR GRADE, WITH NECROSIS, CALCIFICATIONS: PRESENT IN DCIS of the LEFT breast, 12:00 o'clock, 7cmfn, (ribbon clip). This was found to be concordant by Dr. Laveda Abbe. Pathology  results were discussed with the patient by telephone. The patient reported doing well after the biopsy with tenderness at the site. Post biopsy instructions and care were reviewed and questions were answered. The patient was encouraged to call [facility] for any additional concerns. My direct phone number was provided. Surgical consultation has been arranged with Dr. Chevis Pretty at [facility] on June 27, 2023. Pathology results reported by Rene Kocher, RN on 06/23/2023. Electronically Signed   By: Harmon Pier M.D.   On: 06/24/2023 12:05   Result Date: 06/24/2023 CLINICAL DATA:  52 year old female presents for tissue sampling of 0.8 cm UPPER LEFT breast mass. EXAM: ULTRASOUND GUIDED LEFT BREAST CORE NEEDLE BIOPSY COMPARISON:  Previous exam(s). PROCEDURE: I met with the patient and we discussed the procedure of ultrasound-guided biopsy, including benefits and alternatives. We discussed the high likelihood of a successful procedure. We discussed the risks of the procedure, including infection, bleeding, tissue injury, clip migration, and inadequate sampling. Informed written consent was given. The usual time-out protocol was performed immediately prior to the procedure. Using sterile technique and 1% Lidocaine as local anesthetic, under direct ultrasound visualization, a 12 gauge spring-loaded device was used to perform biopsy of the 0.8 cm mass at the 12 o'clock position of the LEFT breast 7 cm from the nipple using a MEDIAL approach. At the conclusion of the procedure a RIBBON shaped tissue marker clip was deployed into the biopsy cavity. Follow up 2 view mammogram was performed and dictated separately. IMPRESSION: Ultrasound guided biopsy of 0.8 cm UPPER LEFT breast mass. No apparent complications. Electronically Signed: By: Harmon Pier M.D. On: 06/20/2023 15:03   MM CLIP PLACEMENT LEFT Result Date: 06/20/2023 CLINICAL DATA:  Evaluate RIBBON biopsy clip placement  following ultrasound-guided LEFT breast biopsy. EXAM: 3D DIAGNOSTIC LEFT MAMMOGRAM POST ULTRASOUND BIOPSY COMPARISON:  Previous exam(s). FINDINGS: 3D Mammographic images were obtained following ultrasound guided biopsy of the 0.8 cm UPPER LEFT breast mass. The biopsy marking clip is in expected position at the site of biopsy. IMPRESSION: Appropriate positioning of the RIBBON shaped biopsy marking clip at the site of biopsy in the UPPER LEFT breast. Final Assessment: Post Procedure Mammograms for Marker Placement Electronically Signed   By: Harmon Pier M.D.   On: 06/20/2023 15:15   MM 3D DIAGNOSTIC MAMMOGRAM UNILATERAL LEFT BREAST Result Date:  06/14/2023 CLINICAL DATA:  Recall from screening to evaluate left breast distortion with associated microcalcifications. EXAM: DIGITAL DIAGNOSTIC UNILATERAL LEFT MAMMOGRAM WITH TOMOSYNTHESIS AND CAD; ULTRASOUND LEFT BREAST LIMITED TECHNIQUE: Left digital diagnostic mammography and breast tomosynthesis was performed. The images were evaluated with computer-aided detection. ; Targeted ultrasound examination of the left breast was performed. COMPARISON:  Recent screening mammogram 05/19/2023 ACR Breast Density Category b: There are scattered areas of fibroglandular density. FINDINGS: Additional views of the left breast were obtained. Exam demonstrates a spiculated mass with associated distortion and microcalcifications over the upper slightly outer left breast. The associated fine round/amorphous microcalcifications are centered over the mass and span approximately 3 x 3 x 3.3 cm. Targeted ultrasound is performed, showing a hypoechoic mass with indistinct borders and mild distal acoustic shadowing at the 12 o'clock position of the left breast 7 cm from the nipple measuring approximately 6 x 7 x 8 mm. There are a few associated microcalcifications over this mass. Ultrasound the left axilla demonstrates no abnormal lymph nodes. IMPRESSION: Suspicious spiculated 8 mm mass over the  12 o'clock position of the left breast 7 cm from the nipple with associated microcalcifications centered over the mass spanning 3 cm. No abnormal left axillary lymph nodes. RECOMMENDATION: Recommend ultrasound-guided core needle biopsy of this highly suspicious left breast mass. I have discussed the findings and recommendations with the patient. If applicable, a reminder letter will be sent to the patient regarding the next appointment. BI-RADS CATEGORY  5: Highly suggestive of malignancy. Biopsy scheduling will be facilitated by the Breast Center staff prior to patient's departure. Electronically Signed   By: Elberta Fortis M.D.   On: 06/14/2023 12:04   [place] LIMITED ULTRASOUND INCLUDING AXILLA LEFT BREAST  Result Date: 06/14/2023 CLINICAL DATA:  Recall from screening to evaluate left breast distortion with associated microcalcifications. EXAM: DIGITAL DIAGNOSTIC UNILATERAL LEFT MAMMOGRAM WITH TOMOSYNTHESIS AND CAD; ULTRASOUND LEFT BREAST LIMITED TECHNIQUE: Left digital diagnostic mammography and breast tomosynthesis was performed. The images were evaluated with computer-aided detection. ; Targeted ultrasound examination of the left breast was performed. COMPARISON:  Recent screening mammogram 05/19/2023 ACR Breast Density Category b: There are scattered areas of fibroglandular density. FINDINGS: Additional views of the left breast were obtained. Exam demonstrates a spiculated mass with associated distortion and microcalcifications over the upper slightly outer left breast. The associated fine round/amorphous microcalcifications are centered over the mass and span approximately 3 x 3 x 3.3 cm. Targeted ultrasound is performed, showing a hypoechoic mass with indistinct borders and mild distal acoustic shadowing at the 12 o'clock position of the left breast 7 cm from the nipple measuring approximately 6 x 7 x 8 mm. There are a few associated microcalcifications over this mass. Ultrasound the left axilla demonstrates  no abnormal lymph nodes. IMPRESSION: Suspicious spiculated 8 mm mass over the 12 o'clock position of the left breast 7 cm from the nipple with associated microcalcifications centered over the mass spanning 3 cm. No abnormal left axillary lymph nodes. RECOMMENDATION: Recommend ultrasound-guided core needle biopsy of this highly suspicious left breast mass. I have discussed the findings and recommendations with the patient. If applicable, a reminder letter will be sent to the patient regarding the next appointment. BI-RADS CATEGORY  5: Highly suggestive of malignancy. Biopsy scheduling will be facilitated by the [facility] staff prior to patient's departure. Electronically Signed   By: Elberta Fortis M.D.   On: 06/14/2023 12:04      IMPRESSION/PLAN:  Cancer Staging  Malignant neoplasm of upper-outer quadrant of  left breast in female, estrogen receptor positive (HCC) Staging form: Breast, AJCC 8th Edition - Clinical stage from 07/04/2023: Stage IA (cT1b, cN0, cM0, G2, ER+, PR+, HER2-) - Signed by Lonie Peak, MD on 07/04/2023 Stage prefix: Initial diagnosis Histologic grading system: 3 grade system  Genetic counseling pending. Anticipating lumpectomy.  It was a pleasure meeting the patient today. We discussed the risks, benefits, and side effects of radiotherapy. I recommend post op radiotherapy to the Lt Breast  to reduce her risk of locoregional recurrence by 2/3.  We discussed that radiation would take approximately 4 weeks to complete and that I would give the patient a few weeks to heal following surgery before starting treatment planning.  If chemotherapy were to be given, this would precede radiotherapy. We spoke about acute effects including skin irritation and fatigue as well as much less common late effects including internal organ injury or irritation. We spoke about the latest technology that is used to minimize the risk of late effects for patients undergoing radiotherapy to the breast or  chest wall. No guarantees of treatment were given. The patient is enthusiastic about proceeding with treatment. I look forward to participating in the patient's care.  I will await her referral back to me for postoperative follow-up and eventual CT simulation/treatment planning.  I realized after meeting this pt with ER+ breast cancer has been taking birth control pills. I notified med/onc so they are aware at their consultation w/ her next week and can verify that she has an alternative form of birth control in place.   On date of service, in total, I spent 45 minutes on this encounter. Patient was seen in person.   __________________________________________   Lonie Peak, MD  This document serves as a record of services personally performed by Lonie Peak, MD. It was created on her behalf by Neena Rhymes, a trained medical scribe. The creation of this record is based on the scribe's personal observations and the provider's statements to them. This document has been checked and approved by the attending provider.

## 2023-07-03 NOTE — Progress Notes (Signed)
Location of Breast Cancer: Malignant neoplasm of upper-outer quadrant of left breast in female, estrogen receptor positive   Histology per Pathology Report:  FINAL DIAGNOSIS        1. Breast, left, needle core biopsy, 12:00 position, 7cmfn :       INVASIVE DUCTAL CARCINOMA, SEE NOTE       DUCTAL CARCINOMA IN SITU, CRIBRIFORM, INTERMEDIATE NUCLEAR GRADE, WITH NECROSIS       TUBULE FORMATION: SCORE 3       NUCLEAR PLEOMORPHISM: SCORE 2       MITOTIC COUNT: SCORE 1       TOTAL SCORE: 6       OVERALL GRADE: 2       LYMPHOVASCULAR INVASION: NOT IDENTIFIED       CANCER LENGTH: 1 CM       CALCIFICATIONS: PRESENT IN DCIS       OTHER FINDINGS: NONE       SEE NOTE  Receptor Status Results: IMMUNOHISTOCHEMICAL AND MORPHOMETRIC ANALYSIS PERFORMED MANUALLY The tumor cells are EQUIVOCAL for Her2 (2+). Her2 by FISH will be performed and the results sent separately. Estrogen Receptor:  95 %, POSITIVE, STRONG STAINING INTENSITY Progesterone Receptor:  95%, POSITIVE, STRONG STAINING INTENSITY Proliferation Marker Ki67:  15% REFERENCE RANGE ESTROGEN RECEPT   Did patient present with symptoms (if so, please note symptoms) or was this found on screening mammography?: mammogram  Past/Anticipated interventions by surgeon, if ZOX:WRUE breast seed lumpectomy.   Past/Anticipated interventions by medical oncology, if any: None at this time  Lymphedema issues, if any:  no    Pain issues, if any:  no   SAFETY ISSUES: Prior radiation? no Pacemaker/ICD? no Possible current pregnancy?no, patient has mirena IUD removed September 2024. Currently taking birth control pills.  Is the patient on methotrexate? no  Current Complaints / other details:     BP 99/61 (BP Location: Left Arm, Patient Position: Sitting)   Pulse 85   Temp 98 F (36.7 C) (Oral)   Resp 17   Ht 5\' 8"  (1.727 m)   Wt 248 lb 8 oz (112.7 kg)   SpO2 100%   BMI 37.78 kg/m

## 2023-07-04 ENCOUNTER — Ambulatory Visit
Admission: RE | Admit: 2023-07-04 | Discharge: 2023-07-04 | Disposition: A | Discharge status: Home / Self Care | Payer: Medicaid Other | Source: Ambulatory Visit | Attending: Radiation Oncology | Admitting: Radiation Oncology

## 2023-07-04 ENCOUNTER — Encounter: Payer: Self-pay | Admitting: Radiation Oncology

## 2023-07-04 VITALS — BP 99/61 | HR 85 | Temp 98.0°F | Resp 17 | Ht 68.0 in | Wt 248.5 lb

## 2023-07-04 DIAGNOSIS — Z170 Estrogen receptor positive status [ER+]: Secondary | ICD-10-CM | POA: Insufficient documentation

## 2023-07-04 DIAGNOSIS — C50412 Malignant neoplasm of upper-outer quadrant of left female breast: Secondary | ICD-10-CM | POA: Insufficient documentation

## 2023-07-04 DIAGNOSIS — E785 Hyperlipidemia, unspecified: Secondary | ICD-10-CM | POA: Diagnosis not present

## 2023-07-04 DIAGNOSIS — Z79899 Other long term (current) drug therapy: Secondary | ICD-10-CM | POA: Diagnosis not present

## 2023-07-04 DIAGNOSIS — Z803 Family history of malignant neoplasm of breast: Secondary | ICD-10-CM | POA: Insufficient documentation

## 2023-07-06 NOTE — Therapy (Signed)
OUTPATIENT PHYSICAL THERAPY BREAST CANCER BASELINE EVALUATION   Patient Name: Vanessa Carr MRN: [id number] DOB:1971-02-15, 52 y.o., female Today's Date: 07/07/2023  END OF SESSION:  PT End of Session - 07/07/23 6578     Visit Number 1    Number of Visits 2    Date for PT Re-Evaluation 08/01/23    Authorization Type none needed    PT Start Time 0800    PT Stop Time 0830    PT Time Calculation (min) 30 min    Activity Tolerance Patient tolerated treatment well    Behavior During Therapy [facility] for tasks assessed/performed             Past Medical History:  Diagnosis Date   Anxiety    Dyslipidemia    Past Surgical History:  Procedure Laterality Date   BREAST BIOPSY Left 06/20/2023   [place] LT BREAST BX W LOC DEV 1ST LESION IMG BX SPEC US GUIDE 06/20/2023 [facility] MAMMOGRAPHY   Patient Active Problem List   Diagnosis Date Noted   Malignant neoplasm of upper-outer quadrant of left breast in female, estrogen receptor positive (HCC) 07/04/2023   Abnormal transaminases 06/02/2012   Pyelonephritis 05/31/2012   Hypokalemia 05/31/2012    PCP: Milus Height, PA  REFERRING PROVIDER: Dr. Chevis Pretty  REFERRING DIAG:  Diagnosis  C50.412,Z17.0 (ICD-10-CM) - Malignant neoplasm of upper-outer quadrant of left breast in female, estrogen receptor positive (HCC)    THERAPY DIAG:  Malignant neoplasm of upper-outer quadrant of left breast in female, estrogen receptor positive (HCC)  Abnormal posture  Pain in right arm  Rationale for Evaluation and Treatment: Rehabilitation  ONSET DATE: 06/27/23  SUBJECTIVE:                                                                                                                                                                                           SUBJECTIVE STATEMENT: Patient reports she is here today to be seen by her medical team for her newly diagnosed left breast cancer.   PERTINENT HISTORY:  Patient was diagnosed with left  grade 2 IDC. It measures 8mm. It is ER/PR positive and HER2 neg with a Ki67 of 15%. She will be having a lumpectomy with SLNB on 07/11/23.   PATIENT GOALS:   reduce lymphedema risk and learn post op HEP.   PAIN:  Are you having pain? No. I am having pain and weakness in the Rt arm from a pinched nerve.  I will get it imaged after all of this.    PRECAUTIONS: Active CA   RED FLAGS: None   HAND DOMINANCE: right  WEIGHT BEARING RESTRICTIONS: No  FALLS:  Has patient fallen in last 6 months? 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  I get some time off after surgery.   LEISURE: walk my dog, [employer] weight loss program that uses bands.    PRIOR LEVEL OF FUNCTION: Independent   OBJECTIVE: Note: Objective measures were completed at Evaluation unless otherwise noted.  COGNITION: Overall cognitive status: Within functional limits for tasks assessed    POSTURE:  Forward head and rounded shoulders posture  UPPER EXTREMITY AROM/PROM:  A/PROM RIGHT   eval   Shoulder extension 35  Shoulder flexion 148  Shoulder abduction 145  Shoulder internal rotation   Shoulder external rotation 75    (Blank rows = not tested)  A/PROM LEFT   eval  Shoulder extension 47  Shoulder flexion 152  Shoulder abduction 155  Shoulder internal rotation   Shoulder external rotation 80    (Blank rows = not tested)  CERVICAL AROM: All within normal limits:   UPPER EXTREMITY STRENGTH: Lt: 4/5 without pain and Rt:  4+/5  LYMPHEDEMA ASSESSMENTS (in cm):   LANDMARK RIGHT   eval  10 cm proximal to olecranon process 36.8  Olecranon process 31.5  10 cm proximal to ulnar styloid process 24.3  Just proximal to ulnar styloid process 18.5  Across hand at thumb web space 20.5  At base of 2nd digit 7.3  (Blank rows = not tested)  LANDMARK LEFT   eval  10 cm proximal to olecranon process 37.7  Olecranon process 31.2  10 cm proximal  to ulnar styloid process 25.3  Just proximal to ulnar styloid process 18.8  Across hand at thumb web space 21.5  At base of 2nd digit 7.3  (Blank rows = not tested)  L-DEX LYMPHEDEMA SCREENING: The patient was assessed using the L-Dex machine today to produce a lymphedema index baseline score. The patient will be reassessed on a regular basis (typically every 3 months) to obtain new L-Dex scores. If the score is > 6.5 points away from his/her baseline score indicating onset of subclinical lymphedema, it will be recommended to wear a compression garment for 4 weeks, 12 hours per day and then be reassessed. If the score continues to be > 6.5 points from baseline at reassessment, we will initiate lymphedema treatment. Assessing in this manner has a 95% rate of preventing clinically significant lymphedema.  QUICK DASH SURVEY: 34% (pt notes from Rt arm pain and weakness)   PATIENT EDUCATION:  Education details: Lymphedema risk reduction and post op shoulder/posture HEP Person educated: Patient Education method: Explanation, Demonstration, Handout Education comprehension: Patient verbalized understanding and returned demonstration  HOME EXERCISE PROGRAM: Patient was instructed today in a home exercise program today for post op shoulder range of motion. These included active assist shoulder flexion in sitting, scapular retraction, wall walking with shoulder abduction, and hands behind head external rotation.  She was encouraged to do these twice a day, holding 3 seconds and repeating 5 times when permitted by her physician.   ASSESSMENT:  CLINICAL IMPRESSION: Pt will benefit from a post op PT reassessment to determine needs and from L-Dex screens every 3 months for 2 years to detect subclinical lymphedema.  Pt will benefit from skilled therapeutic intervention to improve on the following deficits: Decreased knowledge of precautions, impaired UE functional use, pain, decreased ROM, postural  dysfunction.   PT treatment/interventions: ADL/self-care home management, pt/family education, therapeutic exercise  REHAB POTENTIAL: Excellent  CLINICAL  DECISION MAKING: Stable/uncomplicated  EVALUATION COMPLEXITY: Low   GOALS: Goals reviewed with patient? YES  LONG TERM GOALS: (STG=LTG)    Name Target Date Goal status  1 Pt will be able to verbalize understanding of pertinent lymphedema risk reduction practices relevant to her dx specifically related to skin care.  Baseline:  No knowledge 07/07/2023 Achieved at eval  2 Pt will be able to return demo and/or verbalize understanding of the post op HEP related to regaining shoulder ROM. Baseline:  No knowledge 07/07/2023 Achieved at eval  3 Pt will be able to verbalize understanding of the importance of attending the post op After Breast CA Class for further lymphedema risk reduction education and therapeutic exercise.  Baseline:  No knowledge 07/07/2023 Achieved at eval  4 Pt will demo she has regained full shoulder ROM and function post operatively compared to baselines.  Baseline: See objective measurements taken today. 08/01/23     PLAN:  PT FREQUENCY/DURATION: EVAL and 1 follow up appointment.   PLAN FOR NEXT SESSION: will reassess 3-4 weeks post op to determine needs.   Patient will follow up at outpatient cancer rehab 3-4 weeks following surgery.  If the patient requires physical therapy at that time, a specific plan will be dictated and sent to the referring physician for approval. The patient was educated today on appropriate basic range of motion exercises to begin post operatively and the importance of attending the After Breast Cancer class following surgery.  Patient was educated today on lymphedema risk reduction practices as it pertains to recommendations that will benefit the patient immediately following surgery.  She verbalized good understanding.    Physical Therapy Information for After Breast Cancer  Surgery/Treatment:  Lymphedema is a swelling condition that you may be at risk for in your arm if you have lymph nodes removed from the armpit area.  After a sentinel node biopsy, the risk is approximately 5-9% and is higher after an axillary node dissection.  There is treatment available for this condition and it is not life-threatening.  Contact your physician or physical therapist with concerns. You may begin the 4 shoulder/posture exercises (see additional sheet) when permitted by your physician (typically a week after surgery).  If you have drains, you may need to wait until those are removed before beginning range of motion exercises.  A general recommendation is to not lift your arms above shoulder height until drains are removed.  These exercises should be done to your tolerance and gently.  This is not a "no pain/no gain" type of recovery so listen to your body and stretch into the range of motion that you can tolerate, stopping if you have pain.  If you are having immediate reconstruction, ask your plastic surgeon about doing exercises as he or she may want you to wait. We encourage you to attend the free one time ABC (After Breast Cancer) class offered by [facility].  You will learn information related to lymphedema risk, prevention and treatment and additional exercises to regain mobility following surgery.  You can call [phone] for more information.  This is offered the 1st and 3rd Monday of each month.  You only attend the class one time. While undergoing any medical procedure or treatment, try to avoid blood pressure being taken or needle sticks from occurring on the arm on the side of cancer.   This recommendation begins after surgery and continues for the rest of your life.  This may help reduce your risk of  getting lymphedema (swelling in your arm). An excellent resource for those seeking information on lymphedema is the National Lymphedema Network's web site. It  can be accessed at www.lymphnet.org If you notice swelling in your hand, arm or breast at any time following surgery (even if it is many years from now), please contact your doctor or physical therapist to discuss this.  Lymphedema can be treated at any time but it is easier for you if it is treated early on.  If you feel like your shoulder motion is not returning to normal in a reasonable amount of time, please contact your surgeon or physical therapist.  [facility] [phone]. [address], [place] [address]  ABC CLASS After Breast Cancer Class  After Breast Cancer Class is a specially designed exercise class to assist you in a safe recover after having breast cancer surgery.  In this class you will learn how to get back to full function whether your drains were just removed or if you had surgery a month ago.  This one-time class is held the 1st and 3rd Monday of every month from 11:00 a.m. until 12:00 noon virtually.  This class is FREE and space is limited. For more information or to register for the next available class, call [phone].  Class Goals  Understand specific stretches to improve the flexibility of you chest and shoulder. Learn ways to safely strengthen your upper body and improve your posture. Understand the warning signs of infection and why you may be at risk for an arm infection. Learn about Lymphedema and prevention.  ** You do not attend this class until after surgery.  Drains must be removed to participate  Patient was instructed today in a home exercise program today for post op shoulder range of motion. These included active assist shoulder flexion in sitting, scapular retraction, wall walking with shoulder abduction, and hands behind head external rotation.  She was encouraged to do these twice a day, holding 3 seconds and repeating 5 times when permitted by her physician.    Idamae Lusher, PT 07/07/2023,  8:34 AM

## 2023-07-07 ENCOUNTER — Encounter (HOSPITAL_BASED_OUTPATIENT_CLINIC_OR_DEPARTMENT_OTHER): Payer: Self-pay | Admitting: General Surgery

## 2023-07-07 ENCOUNTER — Encounter: Payer: Self-pay | Admitting: Rehabilitation

## 2023-07-07 ENCOUNTER — Other Ambulatory Visit: Payer: Self-pay

## 2023-07-07 ENCOUNTER — Ambulatory Visit: Payer: Medicaid Other | Attending: General Surgery | Admitting: Rehabilitation

## 2023-07-07 DIAGNOSIS — Z170 Estrogen receptor positive status [ER+]: Secondary | ICD-10-CM | POA: Insufficient documentation

## 2023-07-07 DIAGNOSIS — R293 Abnormal posture: Secondary | ICD-10-CM | POA: Insufficient documentation

## 2023-07-07 DIAGNOSIS — C50412 Malignant neoplasm of upper-outer quadrant of left female breast: Secondary | ICD-10-CM | POA: Insufficient documentation

## 2023-07-07 DIAGNOSIS — M79601 Pain in right arm: Secondary | ICD-10-CM | POA: Insufficient documentation

## 2023-07-07 NOTE — Progress Notes (Signed)
   07/07/23 0957  PAT Phone Screen  Is the patient taking a GLP-1 receptor agonist? (S)  Yes ([place] LD 07-02-23)  Has the patient been informed on holding medication? Yes  Do You Have Diabetes? No  Do You Have Hypertension? Yes  Have You Ever Been to the ER for Asthma? No  Have You Taken Oral Steroids in the Past 3 Months? (S)  Yes (12-24- for back pain)  Do you Take Phenteramine or any Other Diet Drugs? No  Recent  Lab Work, EKG, CXR? No  Do you have a history of heart problems? No  Any Recent Hospitalizations? No  Height 5\' 8"  (1.727 m)  Weight 112.7 kg  Pat Appointment Scheduled (S)  Yes (BMP, EKG)

## 2023-07-08 ENCOUNTER — Encounter (HOSPITAL_BASED_OUTPATIENT_CLINIC_OR_DEPARTMENT_OTHER)
Admission: RE | Admit: 2023-07-08 | Discharge: 2023-07-08 | Disposition: A | Discharge status: Home / Self Care | Payer: Medicaid Other | Source: Ambulatory Visit | Attending: General Surgery | Admitting: General Surgery

## 2023-07-08 DIAGNOSIS — Z01812 Encounter for preprocedural laboratory examination: Secondary | ICD-10-CM | POA: Insufficient documentation

## 2023-07-08 LAB — BASIC METABOLIC PANEL
Anion gap: 7 (ref 5–15)
BUN: 15 mg/dL (ref 6–20)
CO2: 26 mmol/L (ref 22–32)
Calcium: 9.1 mg/dL (ref 8.9–10.3)
Chloride: 104 mmol/L (ref 98–111)
Creatinine, Ser: 0.67 mg/dL (ref 0.44–1.00)
GFR, Estimated: >60 mL/min (ref >60)
Glucose, Bld: 116 mg/dL — ABNORMAL HIGH (ref 70–99)
Potassium: 4 mmol/L (ref 3.5–5.1)
Sodium: 137 mmol/L (ref 135–145)

## 2023-07-08 LAB — POCT PREGNANCY, URINE: Preg Test, Ur: NEGATIVE

## 2023-07-08 MED ORDER — CHLORHEXIDINE GLUCONATE CLOTH 2 % EX PADS
6.0000 | MEDICATED_PAD | Freq: Once | CUTANEOUS | Status: DC
Start: 2023-07-08 — End: 2023-07-11

## 2023-07-08 MED ORDER — CHLORHEXIDINE GLUCONATE CLOTH 2 % EX PADS: 6.0000 | MEDICATED_PAD | Freq: Once | CUTANEOUS | Status: DC

## 2023-07-08 NOTE — Progress Notes (Signed)

## 2023-07-10 ENCOUNTER — Ambulatory Visit
Admission: RE | Admit: 2023-07-10 | Discharge: 2023-07-10 | Disposition: A | Discharge status: Home / Self Care | Payer: Medicaid Other | Source: Ambulatory Visit | Attending: General Surgery | Admitting: General Surgery

## 2023-07-10 DIAGNOSIS — C50412 Malignant neoplasm of upper-outer quadrant of left female breast: Secondary | ICD-10-CM

## 2023-07-10 HISTORY — PX: BREAST BIOPSY: SHX20

## 2023-07-11 ENCOUNTER — Ambulatory Visit (HOSPITAL_BASED_OUTPATIENT_CLINIC_OR_DEPARTMENT_OTHER)
Admission: RE | Admit: 2023-07-11 | Discharge: 2023-07-11 | Disposition: A | Discharge status: Home / Self Care | Payer: Medicaid Other | Attending: General Surgery | Admitting: General Surgery

## 2023-07-11 ENCOUNTER — Other Ambulatory Visit: Payer: Self-pay

## 2023-07-11 ENCOUNTER — Ambulatory Visit (HOSPITAL_BASED_OUTPATIENT_CLINIC_OR_DEPARTMENT_OTHER): Payer: Medicaid Other | Admitting: Anesthesiology

## 2023-07-11 ENCOUNTER — Encounter (HOSPITAL_COMMUNITY)
Admission: RE | Admit: 2023-07-11 | Discharge: 2023-07-11 | Disposition: A | Discharge status: Home / Self Care | Payer: Medicaid Other | Source: Ambulatory Visit | Attending: General Surgery | Admitting: General Surgery

## 2023-07-11 ENCOUNTER — Ambulatory Visit
Admission: RE | Admit: 2023-07-11 | Discharge: 2023-07-11 | Disposition: A | Discharge status: Home / Self Care | Payer: Medicaid Other | Source: Ambulatory Visit | Attending: General Surgery | Admitting: General Surgery

## 2023-07-11 ENCOUNTER — Encounter (HOSPITAL_BASED_OUTPATIENT_CLINIC_OR_DEPARTMENT_OTHER): Payer: Self-pay | Admitting: General Surgery

## 2023-07-11 ENCOUNTER — Ambulatory Visit (HOSPITAL_COMMUNITY): Payer: Medicaid Other

## 2023-07-11 ENCOUNTER — Encounter (HOSPITAL_BASED_OUTPATIENT_CLINIC_OR_DEPARTMENT_OTHER)
Admission: RE | Disposition: A | Discharge status: Home / Self Care | Payer: Self-pay | Source: Home / Self Care | Attending: General Surgery

## 2023-07-11 DIAGNOSIS — Z8249 Family history of ischemic heart disease and other diseases of the circulatory system: Secondary | ICD-10-CM | POA: Diagnosis not present

## 2023-07-11 DIAGNOSIS — I10 Essential (primary) hypertension: Secondary | ICD-10-CM | POA: Insufficient documentation

## 2023-07-11 DIAGNOSIS — C50912 Malignant neoplasm of unspecified site of left female breast: Secondary | ICD-10-CM | POA: Diagnosis present

## 2023-07-11 DIAGNOSIS — Z9189 Other specified personal risk factors, not elsewhere classified: Secondary | ICD-10-CM

## 2023-07-11 DIAGNOSIS — Z170 Estrogen receptor positive status [ER+]: Secondary | ICD-10-CM | POA: Insufficient documentation

## 2023-07-11 DIAGNOSIS — C50412 Malignant neoplasm of upper-outer quadrant of left female breast: Secondary | ICD-10-CM

## 2023-07-11 DIAGNOSIS — J45909 Unspecified asthma, uncomplicated: Secondary | ICD-10-CM | POA: Diagnosis not present

## 2023-07-11 HISTORY — DX: Essential (primary) hypertension: I10

## 2023-07-11 HISTORY — DX: Unspecified asthma, uncomplicated: J45.909

## 2023-07-11 HISTORY — PX: BREAST LUMPECTOMY WITH RADIOACTIVE SEED AND SENTINEL LYMPH NODE BIOPSY: SHX6550

## 2023-07-11 HISTORY — DX: Depression, unspecified: F32.A

## 2023-07-11 HISTORY — DX: Malignant (primary) neoplasm, unspecified: C80.1

## 2023-07-11 HISTORY — DX: Unspecified osteoarthritis, unspecified site: M19.90

## 2023-07-11 HISTORY — DX: Other specified personal risk factors, not elsewhere classified: Z91.89

## 2023-07-11 LAB — POCT PREGNANCY, URINE: Preg Test, Ur: NEGATIVE

## 2023-07-11 SURGERY — BREAST LUMPECTOMY WITH RADIOACTIVE SEED AND SENTINEL LYMPH NODE BIOPSY
Anesthesia: General | Site: Breast | Laterality: Left

## 2023-07-11 MED ORDER — GABAPENTIN 100 MG PO CAPS
100.0000 mg | ORAL_CAPSULE | ORAL | Status: AC
  Administered 2023-07-11: 100 mg via ORAL

## 2023-07-11 MED ORDER — LACTATED RINGERS IV SOLN: INTRAVENOUS | Status: DC

## 2023-07-11 MED ORDER — EPHEDRINE 5 MG/ML INJ
INTRAVENOUS | Status: AC
  Filled 2023-07-11: qty 5

## 2023-07-11 MED ORDER — MIDAZOLAM HCL 2 MG/2ML IJ SOLN
INTRAMUSCULAR | Status: AC
  Filled 2023-07-11: qty 2

## 2023-07-11 MED ORDER — CEFAZOLIN SODIUM-DEXTROSE 2-4 GM/100ML-% IV SOLN: 2.0000 g | INTRAVENOUS | Status: DC

## 2023-07-11 MED ORDER — FENTANYL CITRATE (PF) 100 MCG/2ML IJ SOLN
INTRAMUSCULAR | Status: AC
  Filled 2023-07-11: qty 2

## 2023-07-11 MED ORDER — DEXAMETHASONE SODIUM PHOSPHATE 10 MG/ML IJ SOLN
INTRAMUSCULAR | Status: AC
  Filled 2023-07-11: qty 1

## 2023-07-11 MED ORDER — ACETAMINOPHEN 500 MG PO TABS
ORAL_TABLET | ORAL | Status: AC
  Filled 2023-07-11: qty 2

## 2023-07-11 MED ORDER — ACETAMINOPHEN 500 MG PO TABS
1000.0000 mg | ORAL_TABLET | ORAL | Status: AC
  Administered 2023-07-11: 1000 mg via ORAL

## 2023-07-11 MED ORDER — OXYCODONE HCL 5 MG PO TABS
ORAL_TABLET | ORAL | Status: AC
  Filled 2023-07-11: qty 1

## 2023-07-11 MED ORDER — LIDOCAINE 2% (20 MG/ML) 5 ML SYRINGE
INTRAMUSCULAR | Status: DC | PRN
  Administered 2023-07-11: 60 mg via INTRAVENOUS

## 2023-07-11 MED ORDER — ONDANSETRON HCL 4 MG/2ML IJ SOLN: 4.0000 mg | Freq: Once | INTRAMUSCULAR | Status: DC | PRN

## 2023-07-11 MED ORDER — ONDANSETRON HCL 4 MG/2ML IJ SOLN
INTRAMUSCULAR | Status: AC
  Filled 2023-07-11: qty 2

## 2023-07-11 MED ORDER — AMISULPRIDE (ANTIEMETIC) 5 MG/2ML IV SOLN: 10.0000 mg | Freq: Once | INTRAVENOUS | Status: DC | PRN

## 2023-07-11 MED ORDER — PROPOFOL 500 MG/50ML IV EMUL
INTRAVENOUS | Status: AC
  Filled 2023-07-11: qty 100

## 2023-07-11 MED ORDER — OXYCODONE HCL 5 MG PO TABS
5.0000 mg | ORAL_TABLET | Freq: Once | ORAL | Status: AC
  Administered 2023-07-11: 5 mg via ORAL

## 2023-07-11 MED ORDER — BUPIVACAINE-EPINEPHRINE 0.25% -1:200000 IJ SOLN
INTRAMUSCULAR | Status: DC | PRN
  Administered 2023-07-11: 10 mL
  Administered 2023-07-11: 9 mL

## 2023-07-11 MED ORDER — PHENYLEPHRINE 80 MCG/ML (10ML) SYRINGE FOR IV PUSH (FOR BLOOD PRESSURE SUPPORT)
PREFILLED_SYRINGE | INTRAVENOUS | Status: AC
  Filled 2023-07-11: qty 10

## 2023-07-11 MED ORDER — PROPOFOL 10 MG/ML IV BOLUS
INTRAVENOUS | Status: AC
  Filled 2023-07-11: qty 20

## 2023-07-11 MED ORDER — CEFAZOLIN SODIUM-DEXTROSE 2-4 GM/100ML-% IV SOLN
INTRAVENOUS | Status: AC
  Filled 2023-07-11: qty 100

## 2023-07-11 MED ORDER — FENTANYL CITRATE (PF) 100 MCG/2ML IJ SOLN
INTRAMUSCULAR | Status: DC | PRN
  Administered 2023-07-11 (×4): 50 ug via INTRAVENOUS

## 2023-07-11 MED ORDER — PROPOFOL 1000 MG/100ML IV EMUL
INTRAVENOUS | Status: AC
  Filled 2023-07-11: qty 200

## 2023-07-11 MED ORDER — PROPOFOL 500 MG/50ML IV EMUL
INTRAVENOUS | Status: DC | PRN
  Administered 2023-07-11: 250 ug/kg/min via INTRAVENOUS
  Administered 2023-07-11: 150 ug/kg/min via INTRAVENOUS

## 2023-07-11 MED ORDER — FENTANYL CITRATE (PF) 100 MCG/2ML IJ SOLN
25.0000 ug | INTRAMUSCULAR | Status: DC | PRN
  Administered 2023-07-11 (×2): 50 ug via INTRAVENOUS

## 2023-07-11 MED ORDER — PROPOFOL 10 MG/ML IV BOLUS
INTRAVENOUS | Status: DC | PRN
  Administered 2023-07-11: 50 ug via INTRAVENOUS
  Administered 2023-07-11: 30 ug via INTRAVENOUS
  Administered 2023-07-11: 50 ug via INTRAVENOUS
  Administered 2023-07-11: 170 ug via INTRAVENOUS

## 2023-07-11 MED ORDER — TECHNETIUM TC 99M TILMANOCEPT KIT
1.0000 | PACK | Freq: Once | INTRAVENOUS | Status: AC | PRN
  Administered 2023-07-11: 1 via INTRADERMAL

## 2023-07-11 MED ORDER — ONDANSETRON HCL 4 MG/2ML IJ SOLN
INTRAMUSCULAR | Status: DC | PRN
  Administered 2023-07-11: 4 mg via INTRAVENOUS

## 2023-07-11 MED ORDER — CEFAZOLIN SODIUM-DEXTROSE 2-3 GM-%(50ML) IV SOLR
INTRAVENOUS | Status: DC | PRN
  Administered 2023-07-11: 2 g via INTRAVENOUS

## 2023-07-11 MED ORDER — MIDAZOLAM HCL 2 MG/2ML IJ SOLN
2.0000 mg | Freq: Once | INTRAMUSCULAR | Status: AC
  Administered 2023-07-11: 2 mg via INTRAVENOUS

## 2023-07-11 MED ORDER — BUPIVACAINE-EPINEPHRINE (PF) 0.5% -1:200000 IJ SOLN
INTRAMUSCULAR | Status: DC | PRN
  Administered 2023-07-11: 30 mL via PERINEURAL

## 2023-07-11 MED ORDER — DEXAMETHASONE SODIUM PHOSPHATE 10 MG/ML IJ SOLN
INTRAMUSCULAR | Status: DC | PRN
  Administered 2023-07-11: 10 mg

## 2023-07-11 MED ORDER — DEXAMETHASONE SODIUM PHOSPHATE 10 MG/ML IJ SOLN
INTRAMUSCULAR | Status: DC | PRN
  Administered 2023-07-11: 10 mg via INTRAVENOUS

## 2023-07-11 MED ORDER — OXYCODONE HCL 5 MG PO TABS: 5.0000 mg | ORAL_TABLET | Freq: Four times a day (QID) | ORAL | 0 refills | Status: DC | PRN

## 2023-07-11 MED ORDER — GABAPENTIN 100 MG PO CAPS
ORAL_CAPSULE | ORAL | Status: AC
  Filled 2023-07-11: qty 1

## 2023-07-11 MED ORDER — FENTANYL CITRATE (PF) 100 MCG/2ML IJ SOLN
50.0000 ug | Freq: Once | INTRAMUSCULAR | Status: AC
  Administered 2023-07-11: 50 ug via INTRAVENOUS

## 2023-07-11 SURGICAL SUPPLY — 35 items
APPLIER CLIP 9.375 MED OPEN (MISCELLANEOUS) ×3
BLADE SURG 15 STRL LF DISP TIS (BLADE) ×1 IMPLANT
CANISTER SUC SOCK COL 7IN (MISCELLANEOUS) IMPLANT
CANISTER SUCT 1200ML W/VALVE (MISCELLANEOUS) IMPLANT
CHLORAPREP W/TINT 26 (MISCELLANEOUS) ×1 IMPLANT
CLIP APPLIE 9.375 MED OPEN (MISCELLANEOUS) ×1 IMPLANT
COVER BACK TABLE 60X90IN (DRAPES) ×1 IMPLANT
COVER MAYO STAND STRL (DRAPES) ×1 IMPLANT
COVER PROBE CYLINDRICAL 5X96 (MISCELLANEOUS) ×1 IMPLANT
DERMABOND ADVANCED .7 DNX12 (GAUZE/BANDAGES/DRESSINGS) ×1 IMPLANT
DRAPE LAPAROSCOPIC ABDOMINAL (DRAPES) ×1 IMPLANT
DRAPE UTILITY XL STRL (DRAPES) ×1 IMPLANT
ELECT COATED BLADE 2.86 ST (ELECTRODE) ×1 IMPLANT
ELECT REM PT RETURN 9FT ADLT (ELECTROSURGICAL) ×1
ELECTRODE REM PT RTRN 9FT ADLT (ELECTROSURGICAL) ×1 IMPLANT
GLOVE BIO SURGEON STRL SZ7.5 (GLOVE) ×1 IMPLANT
GOWN STRL REUS W/ TWL LRG LVL3 (GOWN DISPOSABLE) ×2 IMPLANT
KIT MARKER MARGIN INK (KITS) ×1 IMPLANT
NDL HYPO 25X1 1.5 SAFETY (NEEDLE) ×1 IMPLANT
NDL SAFETY ECLIPSE 18X1.5 (NEEDLE) IMPLANT
NEEDLE HYPO 25X1 1.5 SAFETY (NEEDLE) ×1
NS IRRIG 1000ML POUR BTL (IV SOLUTION) IMPLANT
PACK BASIN DAY SURGERY FS (CUSTOM PROCEDURE TRAY) ×1 IMPLANT
PENCIL SMOKE EVACUATOR (MISCELLANEOUS) ×1 IMPLANT
SLEEVE SCD COMPRESS KNEE MED (STOCKING) ×1 IMPLANT
SPIKE FLUID TRANSFER (MISCELLANEOUS) IMPLANT
SPONGE T-LAP 18X18 ~~LOC~~+RFID (SPONGE) ×1 IMPLANT
SUT MON AB 4-0 PC3 18 (SUTURE) ×2 IMPLANT
SUT SILK 2 0 SH (SUTURE) IMPLANT
SUT VICRYL 3-0 CR8 SH (SUTURE) ×1 IMPLANT
SYR CONTROL 10ML LL (SYRINGE) ×1 IMPLANT
TOWEL GREEN STERILE FF (TOWEL DISPOSABLE) ×1 IMPLANT
TRAY FAXITRON CT DISP (TRAY / TRAY PROCEDURE) ×1 IMPLANT
TUBE CONNECTING 20X1/4 (TUBING) IMPLANT
YANKAUER SUCT BULB TIP NO VENT (SUCTIONS) IMPLANT

## 2023-07-11 NOTE — Anesthesia Postprocedure Evaluation (Signed)
 Anesthesia Post Note  Patient: Vanessa Carr  Procedure(s) Performed: LEFT BREAST RADIOACTIVE SEED LOCALIZED LUMPECTOMY AND SENTINEL NODE BIOPSY (Left: Breast)     Patient location during evaluation: PACU Anesthesia Type: General Level of consciousness: awake and alert Pain management: pain level controlled Vital Signs Assessment: post-procedure vital signs reviewed and stable Respiratory status: spontaneous breathing, nonlabored ventilation and respiratory function stable Cardiovascular status: blood pressure returned to baseline and stable Postop Assessment: no apparent nausea or vomiting Anesthetic complications: no   No notable events documented.  Last Vitals:  Vitals:   07/11/23 1230 07/11/23 1256  BP: 131/69 (!) 146/76  Pulse: 97 88  Resp: 13 18  Temp:  36.5 C  SpO2: 94% 100%    Last Pain:  Vitals:   07/11/23 1256  TempSrc: Temporal  PainSc: 5                  Garnette FORBES Skillern

## 2023-07-11 NOTE — H&P (Signed)
 REFERRING PHYSICIAN: Leonel Cole, MD PROVIDER: DEWARD GARNETTE NULL, MD MRN: [id number] DOB: 01-Feb-1971 Subjective   Chief Complaint: Breast Cancer  History of Present Illness: Vanessa Carr is a 53 y.o. female who is seen today as an office consultation for evaluation of Breast Cancer  We are asked to see the patient in consultation by Dr. Cole Leonel to evaluate her for a new left breast cancer. The patient is a 53 year old white female who recently went for a routine screening mammogram. At that time she was found to have a small 8 mm mass with 3.3 cm of associated calcifications in the upper portion of the left breast. The axilla looked normal. The mass was biopsied and came back as a grade 2 invasive ductal cancer that was ER and PR positive and HER2 negative with a Ki-67 of 15%. She does have some asthma and hypertension but does not smoke. She does have a family history of breast cancer in her mother and a maternal aunt  Review of Systems: A complete review of systems was obtained from the patient. I have reviewed this information and discussed as appropriate with the patient. See HPI as well for other ROS.  ROS   Medical History: Past Medical History:  Diagnosis Date  Anxiety  Asthma, unspecified asthma severity, unspecified whether complicated, unspecified whether persistent (HHS-HCC)  History of cancer   There is no problem list on file for this patient.  History reviewed. No pertinent surgical history.   No Known Allergies  Current Outpatient Medications on File Prior to Visit  Medication Sig Dispense Refill  FEROSUL 325 mg (65 mg iron) tablet Take 325 mg by mouth every other day  lisinopriL-hydroCHLOROthiazide (ZESTORETIC) 20-25 mg tablet Take 1 tablet by mouth once daily  LORazepam (ATIVAN) 0.5 MG tablet TAKE 1 TABLET BY MOUTH TWICE A DAY AS NEEDED TO HELP WITH ANXIETY  meloxicam (MOBIC) 15 MG tablet  norethindrone (MICRONOR) 0.35 mg tablet Take 0.35 mg by mouth once  daily  rOPINIRole (REQUIP) 3 MG immediate release tablet TAKE 1 TABLET BY MOUTH ONCE A DAY 1 TO 3 HOURS BEFORE BEDTIME  traMADoL (ULTRAM) 50 mg tablet take 1 tablet by mouth every 8 hours as needed for 30 days   No current facility-administered medications on file prior to visit.   Family History  Problem Relation Age of Onset  Skin cancer Mother  Hyperlipidemia (Elevated cholesterol) Mother  High blood pressure (Hypertension) Mother  Diabetes Mother  Breast cancer Mother  High blood pressure (Hypertension) Father  Hyperlipidemia (Elevated cholesterol) Father  Stroke Sister  Diabetes Sister    Social History   Tobacco Use  Smoking Status Never  Smokeless Tobacco Never    Social History   Socioeconomic History  Marital status: Divorced  Tobacco Use  Smoking status: Never  Smokeless tobacco: Never   Social Drivers of Health   Received from [employer]  Social Network   Objective:   Vitals:  BP: 116/72  Pulse: (!) 119  Weight: (!) 111.5 kg (245 lb 12.8 oz)  Height: 172.7 cm (5' 8)  PainSc: 0-No pain   Body mass index is 37.37 kg/m.  Physical Exam Vitals reviewed.  Constitutional:  General: She is not in acute distress. Appearance: Normal appearance.  HENT:  Head: Normocephalic and atraumatic.  Right Ear: External ear normal.  Left Ear: External ear normal.  Nose: Nose normal.  Mouth/Throat:  Mouth: Mucous membranes are moist.  Pharynx: Oropharynx is clear.  Eyes:  General: No scleral icterus. Extraocular Movements:  Extraocular movements intact.  Conjunctiva/sclera: Conjunctivae normal.  Pupils: Pupils are equal, round, and reactive to light.  Cardiovascular:  Rate and Rhythm: Normal rate and regular rhythm.  Pulses: Normal pulses.  Heart sounds: Normal heart sounds.  Pulmonary:  Effort: Pulmonary effort is normal. No respiratory distress.  Breath sounds: Normal breath sounds.  Abdominal:  General: Bowel sounds are normal.  Palpations:  Abdomen is soft.  Tenderness: There is no abdominal tenderness.  Musculoskeletal:  General: No swelling, tenderness or deformity. Normal range of motion.  Cervical back: Normal range of motion and neck supple.  Skin: General: Skin is warm and dry.  Coloration: Skin is not jaundiced.  Neurological:  General: No focal deficit present.  Mental Status: She is alert and oriented to person, place, and time.  Psychiatric:  Mood and Affect: Mood normal.  Behavior: Behavior normal.     Breast: There is no palpable mass in either breast. There is no palpable axillary, supraclavicular, or cervical lymphadenopathy.  Labs, Imaging and Diagnostic Testing:  Assessment and Plan:   Diagnoses and all orders for this visit:  Malignant neoplasm of upper-outer quadrant of left breast in female, estrogen receptor positive (CMS/HHS-HCC)   The patient appears to have a 3.3 cm area of cancer in the upper outer quadrant of the left breast with clinically negative nodes and all favorable markers. I have discussed with her in detail the different options for treatment and at this point she favors breast conservation which I feel is very reasonable. She is also a good candidate for sentinel node biopsy. I have discussed with her in detail the risks and benefits of the operation as well as some of the technical aspects including use of a radioactive seed for localization and she understands and wishes to proceed. We will move forward with surgical scheduling. We will also refer her to medical and radiation oncology to discuss adjuvant therapy.

## 2023-07-11 NOTE — Op Note (Signed)
 07/11/2023  11:50 AM  PATIENT:  Vanessa Carr  53 y.o. female  PRE-OPERATIVE DIAGNOSIS:  LEFT BREAST CANCER  POST-OPERATIVE DIAGNOSIS:  LEFT BREAST CANCER  PROCEDURE:  Procedure(s) with comments: LEFT BREAST RADIOACTIVE SEED LOCALIZED LUMPECTOMY AND DEEP LEFT AXILLARY SENTINEL NODE BIOPSY (Left) - PEC BLOCK  SURGEON:  Surgeons and Role:    * Curvin Deward MOULD, MD - Primary  PHYSICIAN ASSISTANT:   ASSISTANTS: none   ANESTHESIA:   local and general  EBL:  25 mL   BLOOD ADMINISTERED:none  DRAINS: none   LOCAL MEDICATIONS USED:  MARCAINE      SPECIMEN:  Source of Specimen:  left breast tissue and sentinel nodes x 3  DISPOSITION OF SPECIMEN:  PATHOLOGY  COUNTS:  YES  TOURNIQUET:  * No tourniquets in log *  DICTATION: .Dragon Dictation  After informed consent was obtained the patient was brought to the operating room and placed in the supine position on the operating table.  After adequate induction of general anesthesia the patient's left chest, breast, and axillary area were prepped with ChloraPrep, allowed to dry, and draped in usual sterile manner.  An appropriate timeout was performed.  Previously an I-125 seed was placed in the upper outer quadrant of the left breast to mark an area of invasive breast cancer.  Also earlier in the day the patient underwent injection of 1 mCi of technetium sulfur colloid in the subareolar position on the left.  The neoprobe was initially set to technetium and an area of radioactivity was readily identified in the left axilla.  The area overlying this was infiltrated with quarter percent Marcaine .  A small transversely oriented incision was made with a 15 blade knife overlying the area of radioactivity.  The incision was carried through the skin and subcutaneous tissue sharply with the electrocautery until the deep left axillary space was entered.  The neoprobe was used to direct blunt hemostat dissection.  I was able to identify 3 lymph nodes with  increased signal.  Each of these lymph nodes was excised sharply with the electrocautery and the surrounding small vessels and lymphatics were controlled with clips.  Ex vivo counts on these nodes ranged from [phone].  No other hot or palpable nodes were identified in the left axilla.  Hemostasis was achieved using the Bovie electrocautery.  The deep layer of the incision was then closed with interrupted 3-0 Vicryl stitches.  The skin was closed with a running 4-0 Monocryl subcuticular stitch.  Attention was then turned to the left breast.  The neoprobe was set to I-125 in the area of radioactivity was readily identified.  Because of the size of the area involved I elected to make an elliptical incision in the skin overlying the area of radioactivity to allow us  to get as much tissue as possible.  The area around this had been infiltrated with quarter percent Marcaine .  The incision was carried through the skin and subcutaneous tissue sharply with the electrocautery.  Dissection was then carried widely around the radioactive seed while checking the area of radioactivity frequently.  This dissection was carried all the way to the muscle of the chest wall.  Once the specimen was removed it was oriented with the appropriate paint colors.  A specimen radiograph was obtained that showed the clip and seed to be near the center of the specimen.  The specimen was then sent to pathology for further evaluation.  Hemostasis was achieved using the Bovie electrocautery.  The wound was irrigated with  saline and infiltrated with more quarter percent Marcaine .  The cavity was marked with clips.  The deep layer of the incision was then closed with layers of interrupted 3-0 Vicryl stitches.  The skin was closed with a running 4-0 Monocryl subcuticular stitch.  Dermabond dressings were applied.  The patient tolerated the procedure well.  At the end of the case all needle sponge and instrument counts were correct.  The patient was  then awakened and taken to recovery in stable condition.  PLAN OF CARE: Discharge to home after PACU  PATIENT DISPOSITION:  PACU - hemodynamically stable.   Delay start of Pharmacological VTE agent (>24hrs) due to surgical blood loss or risk of bleeding: not applicable

## 2023-07-11 NOTE — Anesthesia Procedure Notes (Signed)
 Anesthesia Regional Block: Pectoralis block   Pre-Anesthetic Checklist: , timeout performed,  Correct Patient, Correct Site, Correct Laterality,  Correct Procedure, Correct Position, site marked,  Risks and benefits discussed,  Surgical consent,  Pre-op evaluation,  At surgeon's request and post-op pain management  Laterality: Left  Prep: chloraprep       Needles:  Injection technique: Single-shot  Needle Type: Echogenic Needle     Needle Length: 9cm  Needle Gauge: 21     Additional Needles:   Procedures:,,,, ultrasound used (permanent image in chart),,    Narrative:  Start time: 07/11/2023 9:30 AM End time: 07/11/2023 9:37 AM Injection made incrementally with aspirations every 5 mL.  Performed by: Personally  Anesthesiologist: Corinne Garnette BRAVO, MD  Additional Notes: No pain on injection. No increased resistance to injection. Injection made in 5cc increments.  Good needle visualization.  Patient tolerated procedure well.

## 2023-07-11 NOTE — Transfer of Care (Signed)
 Immediate Anesthesia Transfer of Care Note  Patient: Vanessa Carr  Procedure(s) Performed: LEFT BREAST RADIOACTIVE SEED LOCALIZED LUMPECTOMY AND SENTINEL NODE BIOPSY (Left: Breast)  Patient Location: PACU  Anesthesia Type:General  Level of Consciousness: drowsy and patient cooperative  Airway & Oxygen Therapy: Patient Spontanous Breathing and Patient connected to face mask oxygen  Post-op Assessment: Report given to RN and Post -op Vital signs reviewed and stable  Post vital signs: Reviewed and stable  Last Vitals:  Vitals Value Taken Time  BP 122/56 07/11/23 1203  Temp 36.2 C 07/11/23 1203  Pulse 99 07/11/23 1213  Resp 25 07/11/23 1213  SpO2 100 % 07/11/23 1213  Vitals shown include unfiled device data.  Last Pain:  Vitals:   07/11/23 1203  TempSrc:   PainSc: Asleep      Patients Stated Pain Goal: 3 (07/11/23 0917)  Complications: No notable events documented.

## 2023-07-11 NOTE — Anesthesia Procedure Notes (Signed)
 Procedure Name: LMA Insertion Date/Time: 07/11/2023 10:14 AM  Performed by: Leotha Andrez DEL, CRNAPre-anesthesia Checklist: Patient identified, Emergency Drugs available, Suction available and Patient being monitored Patient Re-evaluated:Patient Re-evaluated prior to induction Oxygen Delivery Method: Circle System Utilized Preoxygenation: Pre-oxygenation with 100% oxygen Induction Type: IV induction Ventilation: Mask ventilation without difficulty LMA: LMA inserted LMA Size: 4.0 Number of attempts: 1 Placement Confirmation: positive ETCO2 Tube secured with: Tape Dental Injury: Teeth and Oropharynx as per pre-operative assessment

## 2023-07-11 NOTE — Interval H&P Note (Signed)
 History and Physical Interval Note:  07/11/2023 10:00 AM  Vanessa Carr  has presented today for surgery, with the diagnosis of LEFT BREAST CANCER.  The various methods of treatment have been discussed with the patient and family. After consideration of risks, benefits and other options for treatment, the patient has consented to  Procedure(s) with comments: LEFT BREAST RADIOACTIVE SEED LOCALIZED LUMPECTOMY AND SENTINEL NODE BIOPSY (Left) - PEC BLOCK as a surgical intervention.  The patient's history has been reviewed, patient examined, no change in status, stable for surgery.  I have reviewed the patient's chart and labs.  Questions were answered to the patient's satisfaction.     [name]

## 2023-07-11 NOTE — Discharge Instructions (Addendum)
  Post Anesthesia Home Care Instructions  Activity: Get plenty of rest for the remainder of the day. A responsible individual must stay with you for 24 hours following the procedure.  For the next 24 hours, DO NOT: -Drive a car -Advertising copywriter -Drink alcoholic beverages -Take any medication unless instructed by your physician -Make any legal decisions or sign important papers.  Meals: Start with liquid foods such as gelatin or soup. Progress to regular foods as tolerated. Avoid greasy, spicy, heavy foods. If nausea and/or vomiting occur, drink only clear liquids until the nausea and/or vomiting subsides. Call your physician if vomiting continues.  Special Instructions/Symptoms: Your throat may feel dry or sore from the anesthesia or the breathing tube placed in your throat during surgery. If this causes discomfort, gargle with warm salt water. The discomfort should disappear within 24 hours.  If you had a scopolamine patch placed behind your ear for the management of post- operative nausea and/or vomiting:  1. The medication in the patch is effective for 72 hours, after which it should be removed.  Wrap patch in a tissue and discard in the trash. Wash hands thoroughly with soap and water. 2. You may remove the patch earlier than 72 hours if you experience unpleasant side effects which may include dry mouth, dizziness or visual disturbances. 3. Avoid touching the patch. Wash your hands with soap and water after contact with the patch.   Next dose of Tylenol may be given at 3:30pm if needed.

## 2023-07-11 NOTE — Anesthesia Preprocedure Evaluation (Addendum)
 Anesthesia Evaluation  Patient identified by MRN, date of birth, ID band Patient awake    Reviewed: Allergy & Precautions, NPO status , Patient's Chart, lab work & pertinent test results  Airway Mallampati: II  TM Distance: >3 FB Neck ROM: Full    Dental  (+) Teeth Intact, Dental Advisory Given   Pulmonary asthma    Pulmonary exam normal breath sounds clear to auscultation       Cardiovascular hypertension, Pt. on medications Normal cardiovascular exam Rhythm:Regular Rate:Normal     Neuro/Psych  PSYCHIATRIC DISORDERS Anxiety Depression    negative neurological ROS     GI/Hepatic negative GI ROS, Neg liver ROS,,,  Endo/Other  negative endocrine ROS    Renal/GU negative Renal ROS     Musculoskeletal  (+) Arthritis ,    Abdominal   Peds  Hematology negative hematology ROS (+)   Anesthesia Other Findings Day of surgery medications reviewed with the patient.  LEFT BREAST CANCER  Reproductive/Obstetrics                             Anesthesia Physical Anesthesia Plan  ASA: 2  Anesthesia Plan: General   Post-op Pain Management: Regional block*, Tylenol  PO (pre-op)* and Gabapentin  PO (pre-op)*   Induction: Intravenous  PONV Risk Score and Plan: 3 and Midazolam , Dexamethasone , Ondansetron  and TIVA  Airway Management Planned: LMA  Additional Equipment:   Intra-op Plan:   Post-operative Plan: Extubation in OR  Informed Consent: I have reviewed the patients History and Physical, chart, labs and discussed the procedure including the risks, benefits and alternatives for the proposed anesthesia with the patient or authorized representative who has indicated his/her understanding and acceptance.     Dental advisory given  Plan Discussed with: CRNA  Anesthesia Plan Comments: [facility])       Anesthesia Quick Evaluation

## 2023-07-11 NOTE — Progress Notes (Signed)
Assisted Dr. Turk with left, pectoralis, ultrasound guided block. Side rails up, monitors on throughout procedure. See vital signs in flow sheet. Tolerated Procedure well. 

## 2023-07-12 ENCOUNTER — Encounter (HOSPITAL_BASED_OUTPATIENT_CLINIC_OR_DEPARTMENT_OTHER): Payer: Self-pay | Admitting: General Surgery

## 2023-07-14 ENCOUNTER — Inpatient Hospital Stay: Payer: Medicaid Other | Admitting: Hematology

## 2023-07-14 ENCOUNTER — Telehealth: Payer: Self-pay | Admitting: Hematology

## 2023-07-14 ENCOUNTER — Ambulatory Visit: Payer: Self-pay | Admitting: General Surgery

## 2023-07-14 LAB — SURGICAL PATHOLOGY

## 2023-07-17 ENCOUNTER — Encounter (HOSPITAL_BASED_OUTPATIENT_CLINIC_OR_DEPARTMENT_OTHER): Payer: Self-pay | Admitting: General Surgery

## 2023-07-17 ENCOUNTER — Other Ambulatory Visit: Payer: Self-pay

## 2023-07-18 ENCOUNTER — Inpatient Hospital Stay: Payer: Medicaid Other | Attending: Hematology | Admitting: Hematology

## 2023-07-18 ENCOUNTER — Inpatient Hospital Stay: Payer: Medicaid Other

## 2023-07-18 ENCOUNTER — Telehealth: Payer: Self-pay | Admitting: *Deleted

## 2023-07-18 ENCOUNTER — Encounter: Payer: Self-pay | Admitting: Hematology

## 2023-07-18 ENCOUNTER — Encounter: Payer: Self-pay | Admitting: *Deleted

## 2023-07-18 VITALS — BP 102/69 | HR 83 | Temp 97.7°F | Resp 16 | Ht 68.0 in | Wt 245.0 lb

## 2023-07-18 DIAGNOSIS — F419 Anxiety disorder, unspecified: Secondary | ICD-10-CM | POA: Insufficient documentation

## 2023-07-18 DIAGNOSIS — C50412 Malignant neoplasm of upper-outer quadrant of left female breast: Secondary | ICD-10-CM | POA: Diagnosis not present

## 2023-07-18 DIAGNOSIS — I10 Essential (primary) hypertension: Secondary | ICD-10-CM | POA: Diagnosis not present

## 2023-07-18 DIAGNOSIS — G8918 Other acute postprocedural pain: Secondary | ICD-10-CM | POA: Diagnosis not present

## 2023-07-18 DIAGNOSIS — Z1732 Human epidermal growth factor receptor 2 negative status: Secondary | ICD-10-CM | POA: Insufficient documentation

## 2023-07-18 DIAGNOSIS — Z170 Estrogen receptor positive status [ER+]: Secondary | ICD-10-CM | POA: Diagnosis not present

## 2023-07-18 MED ORDER — VENLAFAXINE HCL ER 37.5 MG PO CP24: 37.5000 mg | ORAL_CAPSULE | Freq: Every day | ORAL | 1 refills | Status: DC

## 2023-07-18 MED ORDER — OXYCODONE HCL 5 MG PO TABS: 5.0000 mg | ORAL_TABLET | Freq: Four times a day (QID) | ORAL | 0 refills | Status: AC | PRN

## 2023-07-18 NOTE — Telephone Encounter (Signed)
 Received order for oncotype testing. Requisition sent to pathology.

## 2023-07-18 NOTE — Progress Notes (Signed)
 [facility]   Telephone:([phone]) [phone] Fax:([phone]) [phone]   Clinic New Consult Note   Patient Care Team: Leonel Cole, MD as PCP - General (Family Medicine) Alvera Reagin, [place] (Physician Assistant) 07/18/2023  CHIEF COMPLAINTS/PURPOSE OF CONSULTATION:  Newly diagnosed left breast cancer  REFERRING PHYSICIAN: Dr. Curvin   Discussed the use of AI scribe software for clinical note transcription with the patient, who gave verbal consent to proceed.  History of Present Illness   A 53 year old patient with a recent diagnosis of breast cancer presents for a consultation.  She was referred by her surgeon Dr. Rufino.  She presents to clinic by herself today.   The patient was diagnosed following an abnormal mammogram and subsequent biopsy. The patient did not feel the lump herself, indicating it was likely small at the time of detection. The patient underwent initial surgery but requires a second operation due to positive margins. The patient reports significant post-operative pain, particularly under the arm where two incisions were made and lymph nodes removed. The pain is described as a feeling of swelling and tenderness, which has been disrupting the patient's sleep. The patient has been managing the pain with Tylenol  and has finished a course of oxycodone  prescribed post-surgery.  In addition to the breast cancer, the patient has a history of high blood pressure and is currently on medication for it. The patient also reports experiencing anxiety since the cancer diagnosis, for which lorazepam has been prescribed as needed. The patient has a history of depression related to a past divorce but has been managing well until the recent diagnosis. The patient also reports occasional back pain, for which tramadol is taken as needed.  The patient has a strong family history of breast cancer, with her mother, sister, maternal aunt, and cousin all having been diagnosed with the disease. The  patient has not previously undergone genetic testing. The patient is premenopausal and recently stopped taking birth control due to the breast cancer diagnosis.         MEDICAL HISTORY:  Past Medical History:  Diagnosis Date   Anxiety    Arthritis    low back pain, legs   Asthma    At risk for malignant hyperthermia    [place]   Cancer [facility])    left breast IDC   Depression    Dyslipidemia    Hypertension     SURGICAL HISTORY: Past Surgical History:  Procedure Laterality Date   BREAST BIOPSY Left 06/20/2023   US  LT BREAST BX W LOC DEV 1ST LESION IMG BX SPEC US  GUIDE 06/20/2023 [facility]   BREAST BIOPSY  07/10/2023   MM LT RADIOACTIVE SEED LOC MAMMO GUIDE 07/10/2023 [facility]   BREAST LUMPECTOMY WITH RADIOACTIVE SEED AND SENTINEL LYMPH NODE BIOPSY Left 07/11/2023   Procedure: LEFT BREAST RADIOACTIVE SEED LOCALIZED LUMPECTOMY AND SENTINEL NODE BIOPSY;  Surgeon: Curvin Deward MOULD, MD;  Location: [facility];  Service: General;  Laterality: Left;  PEC BLOCK   WISDOM TOOTH EXTRACTION      SOCIAL HISTORY: Social History   Socioeconomic History   Marital status: Divorced    Spouse name: Not on file   Number of children: 3   Years of education: Not on file   Highest education level: Not on file  Occupational History   Not on file  Tobacco Use   Smoking status: Never   Smokeless tobacco: Never  Substance and Sexual Activity   Alcohol use: Not Currently    Comment:  socially   Drug use: No   Sexual activity: Yes    Birth control/protection: Pill    Comment: LD 07-06-23  Other Topics Concern   Not on file  Social History Narrative   ** Merged History Encounter **       Social Drivers of Health   Financial Resource Strain: Not on file  Food Insecurity: No Food Insecurity (07/04/2023)   Hunger Vital Sign    Worried About Running Out of Food in the Last Year: Never true    Ran Out of Food in the Last Year: Never true  Transportation  Needs: No Transportation Needs (07/04/2023)   PRAPARE - Administrator, Civil Service (Medical): No    Lack of Transportation (Non-Medical): No  Physical Activity: Not on file  Stress: Not on file  Social Connections: Unknown (03/26/2023)   Received from [facility]   Social Network    Social Network: Not on file  Intimate Partner Violence: Not At Risk (07/04/2023)   Humiliation, Afraid, Rape, and Kick questionnaire    Fear of Current or Ex-Partner: No    Emotionally Abused: No    Physically Abused: No    Sexually Abused: No    FAMILY HISTORY: Family History  Problem Relation Age of Onset   Cancer Mother        Breast   Hypertension Mother    Breast cancer Mother 16   Cancer Father        stomach cancer   Asthma Father    Cancer Sister        breast   Breast cancer Sister 68   Cancer Maternal Aunt        breast cancer   Breast cancer Maternal Aunt 88    ALLERGIES:  has no known allergies.  MEDICATIONS:  Current Outpatient Medications  Medication Sig Dispense Refill   venlafaxine  XR (EFFEXOR  XR) 37.5 MG 24 hr capsule Take 1 capsule (37.5 mg total) by mouth daily. 30 capsule 1   albuterol (VENTOLIN HFA) 108 (90 Base) MCG/ACT inhaler 2 puffs every 6 (six) hours as needed for wheezing.     Diclofenac Sodium 3 % GEL Apply 1 Application topically 2 (two) times daily.     FEROSUL 325 (65 Fe) MG tablet Take 325 mg by mouth every other day.     ibuprofen  (ADVIL ,MOTRIN ) 800 MG tablet Take 1 tablet (800 mg total) by mouth 3 (three) times daily. 21 tablet 0   lisinopril-hydrochlorothiazide (ZESTORETIC) 20-25 MG tablet Take 1 tablet by mouth daily.     LORazepam (ATIVAN) 0.5 MG tablet 0.5 mg as needed.     meloxicam (MOBIC) 15 MG tablet Take 15 mg by mouth as needed.     oxyCODONE  (ROXICODONE ) 5 MG immediate release tablet Take 1 tablet (5 mg total) by mouth every 6 (six) hours as needed for severe pain (pain score 7-10). 15 tablet 0   polyethylene glycol powder  (GLYCOLAX /MIRALAX ) 17 GM/SCOOP powder Take 17 g by mouth daily.     rOPINIRole (REQUIP) 3 MG tablet Take 3 mg by mouth at bedtime.     traMADol (ULTRAM) 50 MG tablet Take 50 mg by mouth as needed.     WEGOVY 0.25 MG/0.5ML SOAJ Inject 0.25 mg into the skin once a week.     No current facility-administered medications for this visit.    REVIEW OF SYSTEMS:   Constitutional: Denies fevers, chills or abnormal night sweats Eyes: Denies blurriness of vision, double vision or watery eyes  Ears, nose, mouth, throat, and face: Denies mucositis or sore throat Respiratory: Denies cough, dyspnea or wheezes Cardiovascular: Denies palpitation, chest discomfort or lower extremity swelling Gastrointestinal:  Denies nausea, heartburn or change in bowel habits Skin: Denies abnormal skin rashes Lymphatics: Denies new lymphadenopathy or easy bruising Neurological:Denies numbness, tingling or new weaknesses Behavioral/Psych: Mood is stable, no new changes  All other systems were reviewed with the patient and are negative.  PHYSICAL EXAMINATION: ECOG PERFORMANCE STATUS: 1 - Symptomatic but completely ambulatory  Vitals:   07/18/23 0821  BP: 102/69  Pulse: 83  Resp: 16  Temp: 97.7 F (36.5 C)  SpO2: 100%   Filed Weights   07/18/23 0821  Weight: 245 lb (111.1 kg)    GENERAL:alert, no distress and comfortable SKIN: skin color, texture, turgor are normal, no rashes or significant lesions EYES: normal, conjunctiva are pink and non-injected, sclera clear OROPHARYNX:no exudate, no erythema and lips, buccal mucosa, and tongue normal  NECK: supple, thyroid normal size, non-tender, without nodularity LYMPH:  no palpable lymphadenopathy in the cervical, axillary or inguinal LUNGS: clear to auscultation and percussion with normal breathing effort HEART: regular rate & rhythm and no murmurs and no lower extremity edema ABDOMEN:abdomen soft, non-tender and normal bowel sounds Musculoskeletal:no cyanosis of  digits and no clubbing  PSYCH: alert & oriented x 3 with fluent speech NEURO: no focal motor/sensory deficits  Physical Exam   BREAST: Two incisions on breast, one healing well, another with issues. No significant swelling or hematoma. Tenderness in axillary region post-surgery. SKIN: No significant swelling or hematoma in axillary region. Tenderness in axillary region post-surgery.      LABORATORY DATA:  I have reviewed the data as listed    Latest Ref Rng & Units 09/08/2015    2:11 PM 06/02/2012   10:30 AM 06/01/2012    5:15 AM  CBC  WBC 4.0 - 10.5 K/uL 8.8  6.7  8.7   Hemoglobin 12.0 - 15.0 g/dL 86.7  86.6  88.4   Hematocrit 36.0 - 46.0 % 39.3  37.2  33.3   Platelets 150 - 400 K/uL 86  210  149     @cmpl @  RADIOGRAPHIC STUDIES: I have personally reviewed the radiological images as listed and agreed with the findings in the report. MM Breast Surgical Specimen Result Date: 07/11/2023 CLINICAL DATA:  Status post ultrasound-guided biopsy of a LEFT breast mass which demonstrated invasive ductal carcinoma (RIBBON). Patient presented for localization on January 2nd radioactive seed. EXAM: SPECIMEN RADIOGRAPH OF THE LEFT BREAST COMPARISON:  Previous exam(s). FINDINGS: Status post excision of the LEFT breast. The radioactive seed and RIBBON biopsy marker clip are present and completely intact. IMPRESSION: Specimen radiograph of the LEFT breast. These results were called by telephone at the time of interpretation on 07/11/2023 at 11:41 am to provider Bambi, who verbally acknowledged these results. Electronically Signed   By: Corean Salter M.D.   On: 07/11/2023 11:42   NM Sentinel Node Inj-No Rpt (Breast) Result Date: 07/11/2023 Sulfur Colloid was injected by the Nuclear Medicine Technologist for sentinel lymph node localization.   MM LT RADIOACTIVE SEED LOC MAMMO GUIDE Result Date: 07/10/2023 CLINICAL DATA:  53 year old with recent biopsy-proven grade 2 invasive ductal carcinoma and DCIS  involving the UPPER OUTER QUADRANT of the LEFT breast (ribbon clip). Radioactive seed localization is performed in anticipation of lumpectomy which is scheduled for tomorrow. EXAM: MAMMOGRAPHIC GUIDED RADIOACTIVE SEED LOCALIZATION OF THE LEFT BREAST COMPARISON:  Previous exam(s). FINDINGS: Patient presents for radioactive seed localization prior  to LEFT breast lumpectomy. I met with the patient and we discussed the procedure of seed localization including benefits and alternatives. We discussed the high likelihood of a successful procedure. We discussed the risks of the procedure including infection, bleeding, tissue injury and further surgery. We discussed the low dose of radioactivity involved in the procedure. Informed, written consent was given. The usual time-out protocol was performed immediately prior to the procedure. Using mammographic guidance, sterile technique with chlorhexidine  as skin antisepsis, 1% lidocaine  as local anesthetic, an I-125 radioactive seed was used to localize the ribbon shaped tissue marking clip associated with the biopsy-proven malignancy using a lateral approach. The follow-up mammogram images confirm that the seed is appropriately positioned immediately adjacent to the clip. The calcifications associated with the DCIS extend approximately 2 cm posterior to the seed and clip. The images are marked for Dr. Curvin. Follow-up survey of the patient confirms the presence of the radioactive seed. Order number of I-125 seed: [id number] Total activity: 0.232 mCi Reference Date: 05/28/2023 The patient tolerated the procedure well without apparent immediate complications. [facility] was [facility] from the [facility] with instructions regarding seed removal. IMPRESSION: Radioactive seed localization of biopsy-proven malignancy involving the UPPER OUTER QUADRANT of the LEFT breast. Note that the calcifications associated with DCIS extend approximately 2 cm posterior to the seed and ribbon clip.  Electronically Signed   By: Debby Satterfield M.D.   On: 07/10/2023 14:55   US  LT BREAST BX W LOC DEV 1ST LESION IMG BX SPEC US  GUIDE Addendum Date: 06/24/2023 ADDENDUM REPORT: 06/24/2023 12:05 ADDENDUM: Pathology revealed GRADE II INVASIVE DUCTAL CARCINOMA, DUCTAL CARCINOMA IN SITU, CRIBRIFORM, INTERMEDIATE NUCLEAR GRADE, WITH NECROSIS, CALCIFICATIONS: PRESENT IN DCIS of the LEFT breast, 12:00 o'clock, 7cmfn, (ribbon clip). This was found to be concordant by Dr. Chyrl Phi. Pathology results were discussed with the patient by telephone. The patient reported doing well after the biopsy with tenderness at the site. Post biopsy instructions and care were reviewed and questions were answered. The patient was encouraged to call [facility] for any additional concerns. My direct phone number was provided. Surgical consultation has been arranged with Dr. Deward Curvin at [facility] on June 27, 2023. Pathology results reported by Hendricks Benders, RN on 06/23/2023. Electronically Signed   By: Reyes Phi M.D.   On: 06/24/2023 12:05   Result Date: 06/24/2023 CLINICAL DATA:  53 year old female presents for tissue sampling of 0.8 cm UPPER LEFT breast mass. EXAM: ULTRASOUND GUIDED LEFT BREAST CORE NEEDLE BIOPSY COMPARISON:  Previous exam(s). PROCEDURE: I met with the patient and we discussed the procedure of ultrasound-guided biopsy, including benefits and alternatives. We discussed the high likelihood of a successful procedure. We discussed the risks of the procedure, including infection, bleeding, tissue injury, clip migration, and inadequate sampling. Informed written consent was given. The usual time-out protocol was performed immediately prior to the procedure. Using sterile technique and 1% Lidocaine  as local anesthetic, under direct ultrasound visualization, a 12 gauge spring-loaded device was used to perform biopsy of the 0.8 cm mass at the 12 o'clock position of the LEFT breast  7 cm from the nipple using a MEDIAL approach. At the conclusion of the procedure a RIBBON shaped tissue marker clip was deployed into the biopsy cavity. Follow up 2 view mammogram was performed and dictated separately. IMPRESSION: Ultrasound guided biopsy of 0.8 cm UPPER LEFT breast mass. No apparent complications. Electronically Signed: By: Reyes Phi M.D. On: 06/20/2023 15:03   MM CLIP  PLACEMENT LEFT Result Date: 06/20/2023 CLINICAL DATA:  Evaluate RIBBON biopsy clip placement following ultrasound-guided LEFT breast biopsy. EXAM: 3D DIAGNOSTIC LEFT MAMMOGRAM POST ULTRASOUND BIOPSY COMPARISON:  Previous exam(s). FINDINGS: 3D Mammographic images were obtained following ultrasound guided biopsy of the 0.8 cm UPPER LEFT breast mass. The biopsy marking clip is in expected position at the site of biopsy. IMPRESSION: Appropriate positioning of the RIBBON shaped biopsy marking clip at the site of biopsy in the UPPER LEFT breast. Final Assessment: Post Procedure Mammograms for Marker Placement Electronically Signed   By: Reyes Phi M.D.   On: 06/20/2023 15:15    ASSESSMENT & PLAN:  53 year old premenopausal woman  Malignant neoplasm of upper-outer quadrant of left breast, pT1cN0M0, stage IA, ER+/PR+/HER2-, g2 Invasive ductal carcinoma, stage 1A, with high-grade DCIS. Tumor size 1.5 cm, ER/PR strongly positive, HER2 negative. Initial surgery with lymph node removal showed four negative nodes. Positive margin for DCIS in the superior margin necessitating a second surgery.  -I recommend Oncotype testing to determine chemotherapy need.  -Tamoxifen  planned post-radiation. Discussed potential menopausal side effects of tamoxifen  and aromatase inhibitors. Cure rate for early-stage breast cancer exceeds 90%. - Order Oncotype testing - Schedule second surgery for margin clearance - Start tamoxifen  post-radiation - Consider aromatase inhibitor if patient becomes menopausal - Provide educational materials on  breast cancer staging and treatment  Post-Surgical Pain Pain and tenderness in the axillary region post-lymph node removal. No significant swelling or hematoma. Pain managed with Tylenol ; completed oxycodone  prescription. Discussed ibuprofen  as an anti-inflammatory option, pending surgeon's approval. - Prescribe 15 tablets of oxycodone  - Recommend meloxicam or ibuprofen  for pain management - Confirm with surgeon regarding ibuprofen  use before surgery  Anxiety Increased anxiety related to breast cancer diagnosis and treatment. Currently using lorazepam as needed. Discussed starting venlafaxine  for anxiety, which has no interaction with tamoxifen . - Prescribe venlafaxine  for anxiety - Follow up in a couple of months to assess effectiveness  Hypertension Well-managed with current medication. - Continue current antihypertensive medication  General Health Maintenance Premenopausal with a family history of breast cancer. No smoking or alcohol use. Engaged. Discussed genetic testing due to family history. Removed birth control medication due to breast cancer diagnosis. - Schedule genetic testing for breast cancer - Remove birth control medication from current list  Plan -I refilled her oxycodone  for her severe pain, and called her in venlafaxine  37.5 mg daily for her anxiety and depression -Will order Oncotype on her surgical sample, see if she needs adjuvant chemotherapy -Genetic referral -I will see her back at the end of the radiation, or sooner if Oncotype shows high risk disease.     Orders Placed This Encounter  Procedures   Ambulatory referral to Genetics    Referral Priority:   Routine    Referral Type:   Consultation    Referral Reason:   Specialty Services Required    Number of Visits Requested:   1    All questions were answered. The patient knows to call the clinic with any problems, questions or concerns. I spent 35 minutes counseling the patient face to face. The total  time spent in the appointment was 45 minutes and more than 50% was on counseling.     Onita Mattock, MD 07/18/2023 9:03 AM

## 2023-07-24 ENCOUNTER — Other Ambulatory Visit: Payer: Self-pay

## 2023-07-24 ENCOUNTER — Encounter (HOSPITAL_BASED_OUTPATIENT_CLINIC_OR_DEPARTMENT_OTHER): Payer: Self-pay | Admitting: General Surgery

## 2023-07-24 ENCOUNTER — Encounter (HOSPITAL_BASED_OUTPATIENT_CLINIC_OR_DEPARTMENT_OTHER)
Admission: RE | Disposition: A | Discharge status: Home / Self Care | Payer: Self-pay | Source: Home / Self Care | Attending: General Surgery

## 2023-07-24 ENCOUNTER — Ambulatory Visit (HOSPITAL_BASED_OUTPATIENT_CLINIC_OR_DEPARTMENT_OTHER): Payer: Medicaid Other | Admitting: Anesthesiology

## 2023-07-24 ENCOUNTER — Ambulatory Visit (HOSPITAL_BASED_OUTPATIENT_CLINIC_OR_DEPARTMENT_OTHER)
Admission: RE | Admit: 2023-07-24 | Discharge: 2023-07-24 | Disposition: A | Discharge status: Home / Self Care | Payer: Medicaid Other | Attending: General Surgery | Admitting: General Surgery

## 2023-07-24 DIAGNOSIS — Z1732 Human epidermal growth factor receptor 2 negative status: Secondary | ICD-10-CM | POA: Insufficient documentation

## 2023-07-24 DIAGNOSIS — J45909 Unspecified asthma, uncomplicated: Secondary | ICD-10-CM | POA: Diagnosis not present

## 2023-07-24 DIAGNOSIS — Z803 Family history of malignant neoplasm of breast: Secondary | ICD-10-CM | POA: Diagnosis not present

## 2023-07-24 DIAGNOSIS — C50912 Malignant neoplasm of unspecified site of left female breast: Secondary | ICD-10-CM

## 2023-07-24 DIAGNOSIS — Z170 Estrogen receptor positive status [ER+]: Secondary | ICD-10-CM | POA: Diagnosis not present

## 2023-07-24 DIAGNOSIS — Z01818 Encounter for other preprocedural examination: Secondary | ICD-10-CM

## 2023-07-24 DIAGNOSIS — I10 Essential (primary) hypertension: Secondary | ICD-10-CM | POA: Insufficient documentation

## 2023-07-24 DIAGNOSIS — Z1721 Progesterone receptor positive status: Secondary | ICD-10-CM | POA: Diagnosis not present

## 2023-07-24 HISTORY — PX: RE-EXCISION OF BREAST LUMPECTOMY: SHX6048

## 2023-07-24 LAB — POCT PREGNANCY, URINE: Preg Test, Ur: NEGATIVE

## 2023-07-24 SURGERY — EXCISION, LESION, BREAST
Anesthesia: General | Site: Breast | Laterality: Left

## 2023-07-24 MED ORDER — CHLORHEXIDINE GLUCONATE CLOTH 2 % EX PADS: 6.0000 | MEDICATED_PAD | Freq: Once | CUTANEOUS | Status: DC

## 2023-07-24 MED ORDER — MIDAZOLAM HCL 2 MG/2ML IJ SOLN
INTRAMUSCULAR | Status: AC
  Filled 2023-07-24: qty 2

## 2023-07-24 MED ORDER — ACETAMINOPHEN 10 MG/ML IV SOLN
1000.0000 mg | Freq: Once | INTRAVENOUS | Status: DC | PRN
Start: 2023-07-24 — End: 2023-07-24

## 2023-07-24 MED ORDER — PHENYLEPHRINE 80 MCG/ML (10ML) SYRINGE FOR IV PUSH (FOR BLOOD PRESSURE SUPPORT)
PREFILLED_SYRINGE | INTRAVENOUS | Status: DC | PRN
  Administered 2023-07-24: 160 ug via INTRAVENOUS

## 2023-07-24 MED ORDER — PROPOFOL 10 MG/ML IV BOLUS
INTRAVENOUS | Status: AC
  Filled 2023-07-24: qty 20

## 2023-07-24 MED ORDER — OXYCODONE HCL 5 MG PO TABS: 5.0000 mg | ORAL_TABLET | Freq: Four times a day (QID) | ORAL | 0 refills | Status: AC | PRN

## 2023-07-24 MED ORDER — MIDAZOLAM HCL 5 MG/5ML IJ SOLN
INTRAMUSCULAR | Status: DC | PRN
  Administered 2023-07-24: 2 mg via INTRAVENOUS

## 2023-07-24 MED ORDER — CEFAZOLIN SODIUM-DEXTROSE 2-4 GM/100ML-% IV SOLN
2.0000 g | INTRAVENOUS | Status: AC
  Administered 2023-07-24: 2 g via INTRAVENOUS

## 2023-07-24 MED ORDER — LIDOCAINE 2% (20 MG/ML) 5 ML SYRINGE
INTRAMUSCULAR | Status: DC | PRN
  Administered 2023-07-24: 100 mg via INTRAVENOUS

## 2023-07-24 MED ORDER — GABAPENTIN 100 MG PO CAPS
ORAL_CAPSULE | ORAL | Status: AC
  Filled 2023-07-24: qty 1

## 2023-07-24 MED ORDER — LACTATED RINGERS IV SOLN: INTRAVENOUS | Status: DC

## 2023-07-24 MED ORDER — BUPIVACAINE-EPINEPHRINE (PF) 0.25% -1:200000 IJ SOLN
INTRAMUSCULAR | Status: AC
  Filled 2023-07-24: qty 90

## 2023-07-24 MED ORDER — GABAPENTIN 100 MG PO CAPS
100.0000 mg | ORAL_CAPSULE | ORAL | Status: AC
  Administered 2023-07-24: 100 mg via ORAL

## 2023-07-24 MED ORDER — DEXAMETHASONE SODIUM PHOSPHATE 10 MG/ML IJ SOLN
INTRAMUSCULAR | Status: DC | PRN
  Administered 2023-07-24: 10 mg via INTRAVENOUS

## 2023-07-24 MED ORDER — OXYCODONE HCL 5 MG PO TABS
5.0000 mg | ORAL_TABLET | Freq: Once | ORAL | Status: AC | PRN
  Administered 2023-07-24: 5 mg via ORAL

## 2023-07-24 MED ORDER — CEFAZOLIN SODIUM-DEXTROSE 2-4 GM/100ML-% IV SOLN
INTRAVENOUS | Status: AC
  Filled 2023-07-24: qty 100

## 2023-07-24 MED ORDER — GABAPENTIN 100 MG PO CAPS: 100.0000 mg | ORAL_CAPSULE | Freq: Three times a day (TID) | ORAL | 2 refills | Status: DC

## 2023-07-24 MED ORDER — OXYCODONE HCL 5 MG PO TABS
ORAL_TABLET | ORAL | Status: AC
  Filled 2023-07-24: qty 1

## 2023-07-24 MED ORDER — SODIUM CHLORIDE 0.9 % IV SOLN: INTRAVENOUS | Status: DC | PRN

## 2023-07-24 MED ORDER — PHENYLEPHRINE 80 MCG/ML (10ML) SYRINGE FOR IV PUSH (FOR BLOOD PRESSURE SUPPORT)
PREFILLED_SYRINGE | INTRAVENOUS | Status: AC
  Filled 2023-07-24: qty 10

## 2023-07-24 MED ORDER — DEXAMETHASONE SODIUM PHOSPHATE 10 MG/ML IJ SOLN
INTRAMUSCULAR | Status: AC
  Filled 2023-07-24: qty 1

## 2023-07-24 MED ORDER — LIDOCAINE 2% (20 MG/ML) 5 ML SYRINGE
INTRAMUSCULAR | Status: AC
  Filled 2023-07-24: qty 5

## 2023-07-24 MED ORDER — PROPOFOL 500 MG/50ML IV EMUL
INTRAVENOUS | Status: AC
  Filled 2023-07-24: qty 50

## 2023-07-24 MED ORDER — FENTANYL CITRATE (PF) 100 MCG/2ML IJ SOLN
INTRAMUSCULAR | Status: AC
  Filled 2023-07-24: qty 2

## 2023-07-24 MED ORDER — KETOROLAC TROMETHAMINE 30 MG/ML IJ SOLN
15.0000 mg | Freq: Once | INTRAMUSCULAR | Status: AC
  Administered 2023-07-24: 15 mg via INTRAVENOUS

## 2023-07-24 MED ORDER — OXYCODONE HCL 5 MG/5ML PO SOLN: 5.0000 mg | Freq: Once | ORAL | Status: AC | PRN

## 2023-07-24 MED ORDER — PROPOFOL 10 MG/ML IV BOLUS
INTRAVENOUS | Status: DC | PRN
  Administered 2023-07-24: 225 ug/kg/min via INTRAVENOUS
  Administered 2023-07-24: 150 mg via INTRAVENOUS

## 2023-07-24 MED ORDER — ONDANSETRON HCL 4 MG/2ML IJ SOLN
INTRAMUSCULAR | Status: AC
  Filled 2023-07-24: qty 2

## 2023-07-24 MED ORDER — FENTANYL CITRATE (PF) 100 MCG/2ML IJ SOLN
25.0000 ug | INTRAMUSCULAR | Status: DC | PRN
  Administered 2023-07-24: 50 ug via INTRAVENOUS
  Administered 2023-07-24: 25 ug via INTRAVENOUS
  Administered 2023-07-24: 50 ug via INTRAVENOUS
  Administered 2023-07-24: 25 ug via INTRAVENOUS

## 2023-07-24 MED ORDER — ACETAMINOPHEN 500 MG PO TABS
ORAL_TABLET | ORAL | Status: AC
  Filled 2023-07-24: qty 2

## 2023-07-24 MED ORDER — ONDANSETRON HCL 4 MG/2ML IJ SOLN
INTRAMUSCULAR | Status: DC | PRN
  Administered 2023-07-24: 4 mg via INTRAVENOUS

## 2023-07-24 MED ORDER — ONDANSETRON HCL 4 MG/2ML IJ SOLN: 4.0000 mg | Freq: Once | INTRAMUSCULAR | Status: DC | PRN

## 2023-07-24 MED ORDER — BUPIVACAINE-EPINEPHRINE 0.25% -1:200000 IJ SOLN
INTRAMUSCULAR | Status: DC | PRN
  Administered 2023-07-24: 20 mL

## 2023-07-24 MED ORDER — KETOROLAC TROMETHAMINE 30 MG/ML IJ SOLN
INTRAMUSCULAR | Status: AC
  Filled 2023-07-24: qty 1

## 2023-07-24 MED ORDER — ACETAMINOPHEN 500 MG PO TABS
1000.0000 mg | ORAL_TABLET | ORAL | Status: AC
  Administered 2023-07-24: 1000 mg via ORAL

## 2023-07-24 MED ORDER — FENTANYL CITRATE (PF) 100 MCG/2ML IJ SOLN
INTRAMUSCULAR | Status: DC | PRN
  Administered 2023-07-24 (×2): 50 ug via INTRAVENOUS

## 2023-07-24 SURGICAL SUPPLY — 28 items
BLADE SURG 15 STRL LF DISP TIS (BLADE) ×1 IMPLANT
CANISTER SUCT 1200ML W/VALVE (MISCELLANEOUS) ×1 IMPLANT
CHLORAPREP W/TINT 26 (MISCELLANEOUS) ×1 IMPLANT
COVER BACK TABLE 60X90IN (DRAPES) ×1 IMPLANT
COVER MAYO STAND STRL (DRAPES) ×1 IMPLANT
DERMABOND ADVANCED .7 DNX12 (GAUZE/BANDAGES/DRESSINGS) ×1 IMPLANT
DRAPE LAPAROSCOPIC ABDOMINAL (DRAPES) ×1 IMPLANT
DRAPE UTILITY XL STRL (DRAPES) ×1 IMPLANT
ELECT COATED BLADE 2.86 ST (ELECTRODE) ×1 IMPLANT
ELECT REM PT RETURN 9FT ADLT (ELECTROSURGICAL) ×1
ELECTRODE REM PT RTRN 9FT ADLT (ELECTROSURGICAL) ×1 IMPLANT
GLOVE BIO SURGEON STRL SZ7.5 (GLOVE) ×1 IMPLANT
GOWN STRL REUS W/ TWL LRG LVL3 (GOWN DISPOSABLE) ×2 IMPLANT
KIT MARKER MARGIN INK (KITS) IMPLANT
NDL HYPO 25X1 1.5 SAFETY (NEEDLE) ×1 IMPLANT
NEEDLE HYPO 25X1 1.5 SAFETY (NEEDLE) ×1
NS IRRIG 1000ML POUR BTL (IV SOLUTION) IMPLANT
PACK BASIN DAY SURGERY FS (CUSTOM PROCEDURE TRAY) ×1 IMPLANT
PENCIL SMOKE EVACUATOR (MISCELLANEOUS) ×1 IMPLANT
SLEEVE SCD COMPRESS KNEE MED (STOCKING) ×1 IMPLANT
SPIKE FLUID TRANSFER (MISCELLANEOUS) ×1 IMPLANT
SPONGE T-LAP 18X18 ~~LOC~~+RFID (SPONGE) ×1 IMPLANT
SUT MON AB 4-0 PC3 18 (SUTURE) ×1 IMPLANT
SUT VICRYL 3-0 CR8 SH (SUTURE) ×1 IMPLANT
SYR CONTROL 10ML LL (SYRINGE) ×1 IMPLANT
TOWEL GREEN STERILE FF (TOWEL DISPOSABLE) ×1 IMPLANT
TUBE CONNECTING 20X1/4 (TUBING) ×1 IMPLANT
YANKAUER SUCT BULB TIP NO VENT (SUCTIONS) ×1 IMPLANT

## 2023-07-24 NOTE — Anesthesia Postprocedure Evaluation (Signed)
Anesthesia Post Note  Patient: Vanessa Carr  Procedure(s) Performed: RE-EXCISION OF LEFT BREAST SUPERIOR MARGIN (Left: Breast)     Patient location during evaluation: PACU Anesthesia Type: General Level of consciousness: awake and alert Pain management: pain level controlled Vital Signs Assessment: post-procedure vital signs reviewed and stable Respiratory status: spontaneous breathing, nonlabored ventilation, respiratory function stable and patient connected to nasal cannula oxygen Cardiovascular status: blood pressure returned to baseline and stable Postop Assessment: no apparent nausea or vomiting Anesthetic complications: no   No notable events documented.  Last Vitals:  Vitals:   07/24/23 1000 07/24/23 1024  BP: 98/65 98/74  Pulse: 81 81  Resp: 18 16  Temp:  (!) 36.4 C  SpO2: 94% 96%    Last Pain:  Vitals:   07/24/23 1024  TempSrc: Oral  PainSc: 4                  Mariann Barter

## 2023-07-24 NOTE — Discharge Instructions (Signed)
  Post Anesthesia Home Care Instructions  Activity: Get plenty of rest for the remainder of the day. A responsible individual must stay with you for 24 hours following the procedure.  For the next 24 hours, DO NOT: -Drive a car -Advertising copywriter -Drink alcoholic beverages -Take any medication unless instructed by your physician -Make any legal decisions or sign important papers.  Meals: Start with liquid foods such as gelatin or soup. Progress to regular foods as tolerated. Avoid greasy, spicy, heavy foods. If nausea and/or vomiting occur, drink only clear liquids until the nausea and/or vomiting subsides. Call your physician if vomiting continues.  Special Instructions/Symptoms: Your throat may feel dry or sore from the anesthesia or the breathing tube placed in your throat during surgery. If this causes discomfort, gargle with warm salt water. The discomfort should disappear within 24 hours.  If you had a scopolamine patch placed behind your ear for the management of post- operative nausea and/or vomiting:  1. The medication in the patch is effective for 72 hours, after which it should be removed.  Wrap patch in a tissue and discard in the trash. Wash hands thoroughly with soap and water. 2. You may remove the patch earlier than 72 hours if you experience unpleasant side effects which may include dry mouth, dizziness or visual disturbances. 3. Avoid touching the patch. Wash your hands with soap and water after contact with the patch.    No Tylenol until after 1pm today if needed

## 2023-07-24 NOTE — Transfer of Care (Signed)
Immediate Anesthesia Transfer of Care Note  Patient: Vanessa Carr  Procedure(s) Performed: RE-EXCISION OF LEFT BREAST SUPERIOR MARGIN (Left: Breast)  Patient Location: PACU  Anesthesia Type:General  Level of Consciousness: drowsy  Airway & Oxygen Therapy: Patient Spontanous Breathing and Patient connected to face mask oxygen  Post-op Assessment: Report given to RN and Post -op Vital signs reviewed and stable  Post vital signs: Reviewed and stable  Last Vitals:  Vitals Value Taken Time  BP    Temp    Pulse    Resp    SpO2      Last Pain:  Vitals:   07/24/23 0701  TempSrc: Temporal  PainSc: 4       Patients Stated Pain Goal: 4 (07/24/23 0701)  Complications: No notable events documented.

## 2023-07-24 NOTE — Op Note (Signed)
07/24/2023  8:56 AM  PATIENT:  Vanessa Carr  53 y.o. female  PRE-OPERATIVE DIAGNOSIS:  LEFT BREAST CANCER  POST-OPERATIVE DIAGNOSIS:  LEFT BREAST CANCER  PROCEDURE:  Procedure(s): RE-EXCISION OF LEFT BREAST SUPERIOR MARGIN (Left)  SURGEON:  Surgeons and Role:    * Griselda Miner, MD - Primary  PHYSICIAN ASSISTANT:   ASSISTANTS: none   ANESTHESIA:   local and general  EBL:  2 mL   BLOOD ADMINISTERED:none  DRAINS: none   LOCAL MEDICATIONS USED:  MARCAINE     SPECIMEN:  Source of Specimen:  left breast superior margin  DISPOSITION OF SPECIMEN:  PATHOLOGY  COUNTS:  YES  TOURNIQUET:  * No tourniquets in log *  DICTATION: .Dragon Dictation  After informed consent was obtained the patient was brought to the operating room and placed in the supine position on the operating table.  After adequate induction of general anesthesia and the patient's left breast was prepped with ChloraPrep, allowed to dry, and draped in usual sterile manner.  An appropriate timeout was performed.  The area around the lumpectomy incision was infiltrated with quarter percent Marcaine.  The lumpectomy incision was then reopened sharply with a 15 blade knife.  The incision was carried through the subcutaneous tissue sharply with the electrocautery until the old lumpectomy cavity was opened.  I then removed the tissue at the superior margin.  This was marked with the appropriate paint color.  The tissue was then sent to pathology for further evaluation.  Hemostasis was achieved using the Bovie electrocautery.  The wound was irrigated with saline and infiltrated with more quarter percent Marcaine.  The deep layer of the wound was closed with interrupted 3-0 Vicryl stitches.  The skin was then closed with a running 4-0 Monocryl subcuticular stitch.  Dermabond dressings were applied.  The patient tolerated the procedure well.  At the end of the case all needle sponge and instrument counts were correct.  The  patient was then awakened and taken to recovery in stable condition.  PLAN OF CARE: Discharge to home after PACU  PATIENT DISPOSITION:  PACU - hemodynamically stable.   Delay start of Pharmacological VTE agent (>24hrs) due to surgical blood loss or risk of bleeding: not applicable

## 2023-07-24 NOTE — Anesthesia Procedure Notes (Signed)
Procedure Name: LMA Insertion Date/Time: 07/24/2023 8:22 AM  Performed by: Roosvelt Harps, CRNAPre-anesthesia Checklist: Patient identified, Emergency Drugs available, Suction available and Patient being monitored Patient Re-evaluated:Patient Re-evaluated prior to induction Oxygen Delivery Method: Circle System Utilized Preoxygenation: Pre-oxygenation with 100% oxygen Induction Type: IV induction Ventilation: Mask ventilation without difficulty LMA: LMA inserted LMA Size: 4.0 Number of attempts: 1 Airway Equipment and Method: Bite block Placement Confirmation: positive ETCO2 and breath sounds checked- equal and bilateral Tube secured with: Tape Dental Injury: Teeth and Oropharynx as per pre-operative assessment

## 2023-07-24 NOTE — H&P (Signed)
REFERRING PHYSICIAN: Irven Coe, MD PROVIDER: Lindell Noe, MD MRN: [id number] DOB: 05-29-1971 Subjective   Chief Complaint: Breast Cancer  History of Present Illness: Vanessa Carr is a 53 y.o. female who is seen today as an office consultation for evaluation of Breast Cancer  We are asked to see the patient in consultation by Dr. Irven Coe to evaluate her for a new left breast cancer. The patient is a 53 year old white female who recently went for a routine screening mammogram. At that time she was found to have a small 8 mm mass with 3.3 cm of associated calcifications in the upper portion of the left breast. The axilla looked normal. The mass was biopsied and came back as a grade 2 invasive ductal cancer that was ER and PR positive and HER2 negative with a Ki-67 of 15%. She does have some asthma and hypertension but does not smoke. She does have a family history of breast cancer in her mother and a maternal aunt  Review of Systems: A complete review of systems was obtained from the patient. I have reviewed this information and discussed as appropriate with the patient. See HPI as well for other ROS.  ROS   Medical History: Past Medical History:  Diagnosis Date  Anxiety  Asthma, unspecified asthma severity, unspecified whether complicated, unspecified whether persistent (HHS-HCC)  History of cancer   There is no problem list on file for this patient.  History reviewed. No pertinent surgical history.   No Known Allergies  Current Outpatient Medications on File Prior to Visit  Medication Sig Dispense Refill  FEROSUL 325 mg (65 mg iron) tablet Take 325 mg by mouth every other day  lisinopriL-hydroCHLOROthiazide (ZESTORETIC) 20-25 mg tablet Take 1 tablet by mouth once daily  LORazepam (ATIVAN) 0.5 MG tablet TAKE 1 TABLET BY MOUTH TWICE A DAY AS NEEDED TO HELP WITH ANXIETY  meloxicam (MOBIC) 15 MG tablet  norethindrone (MICRONOR) 0.35 mg tablet Take 0.35 mg by mouth once  daily  rOPINIRole (REQUIP) 3 MG immediate release tablet TAKE 1 TABLET BY MOUTH ONCE A DAY 1 TO 3 HOURS BEFORE BEDTIME  traMADoL (ULTRAM) 50 mg tablet take 1 tablet by mouth every 8 hours as needed for 30 days   No current facility-administered medications on file prior to visit.   Family History  Problem Relation Age of Onset  Skin cancer Mother  Hyperlipidemia (Elevated cholesterol) Mother  High blood pressure (Hypertension) Mother  Diabetes Mother  Breast cancer Mother  High blood pressure (Hypertension) Father  Hyperlipidemia (Elevated cholesterol) Father  Stroke Sister  Diabetes Sister    Social History   Tobacco Use  Smoking Status Never  Smokeless Tobacco Never    Social History   Socioeconomic History  Marital status: Divorced  Tobacco Use  Smoking status: Never  Smokeless tobacco: Never   Social Drivers of Health   Received from [employer]  Social Network   Objective:   Vitals:  BP: 116/72  Pulse: (!) 119  Weight: (!) 111.5 kg (245 lb 12.8 oz)  Height: 172.7 cm (5\' 8" )  PainSc: 0-No pain   Body mass index is 37.37 kg/m.  Physical Exam Vitals reviewed.  Constitutional:  General: She is not in acute distress. Appearance: Normal appearance.  HENT:  Head: Normocephalic and atraumatic.  Right Ear: External ear normal.  Left Ear: External ear normal.  Nose: Nose normal.  Mouth/Throat:  Mouth: Mucous membranes are moist.  Pharynx: Oropharynx is clear.  Eyes:  General: No scleral icterus. Extraocular Movements:  Extraocular movements intact.  Conjunctiva/sclera: Conjunctivae normal.  Pupils: Pupils are equal, round, and reactive to light.  Cardiovascular:  Rate and Rhythm: Normal rate and regular rhythm.  Pulses: Normal pulses.  Heart sounds: Normal heart sounds.  Pulmonary:  Effort: Pulmonary effort is normal. No respiratory distress.  Breath sounds: Normal breath sounds.  Abdominal:  General: Bowel sounds are normal.  Palpations:  Abdomen is soft.  Tenderness: There is no abdominal tenderness.  Musculoskeletal:  General: No swelling, tenderness or deformity. Normal range of motion.  Cervical back: Normal range of motion and neck supple.  Skin: General: Skin is warm and dry.  Coloration: Skin is not jaundiced.  Neurological:  General: No focal deficit present.  Mental Status: She is alert and oriented to person, place, and time.  Psychiatric:  Mood and Affect: Mood normal.  Behavior: Behavior normal.     Breast: There is no palpable mass in either breast. There is no palpable axillary, supraclavicular, or cervical lymphadenopathy.  Labs, Imaging and Diagnostic Testing:  Assessment and Plan:   Diagnoses and all orders for this visit:  Malignant neoplasm of upper-outer quadrant of left breast in female, estrogen receptor positive (CMS/HHS-HCC)   The patient appears to have a 3.3 cm area of cancer in the upper outer quadrant of the left breast with clinically negative nodes and all favorable markers. I have discussed with her in detail the different options for treatment and at this point she favors breast conservation which I feel is very reasonable. She is also a good candidate for sentinel node biopsy. I have discussed with her in detail the risks and benefits of the operation as well as some of the technical aspects including use of a radioactive seed for localization and she understands and wishes to proceed. We will move forward with surgical scheduling. We will also refer her to medical and radiation oncology to discuss adjuvant therapy.  She underwent lumpectomy.  She presents today for reexcision of the margin

## 2023-07-24 NOTE — Anesthesia Preprocedure Evaluation (Addendum)
Anesthesia Evaluation  Patient identified by MRN, date of birth, ID band Patient awake    Reviewed: Allergy & Precautions, NPO status , Patient's Chart, lab work & pertinent test results, reviewed documented beta blocker date and time   History of Anesthesia Complications Negative for: history of anesthetic complications  Airway Mallampati: I  TM Distance: >3 FB Neck ROM: Full    Dental no notable dental hx.    Pulmonary asthma , neg COPD   breath sounds clear to auscultation       Cardiovascular hypertension, (-) CAD, (-) Past MI, (-) Cardiac Stents and (-) CABG (-) dysrhythmias (-) pacemaker Rhythm:Regular Rate:Normal     Neuro/Psych neg Seizures PSYCHIATRIC DISORDERS Anxiety Depression       GI/Hepatic ,neg GERD  ,,(+) neg Cirrhosis        Endo/Other  neg diabetes    Renal/GU Renal disease     Musculoskeletal  (+) Arthritis ,    Abdominal   Peds  Hematology   Anesthesia Other Findings   Reproductive/Obstetrics                             Anesthesia Physical Anesthesia Plan  ASA: 2  Anesthesia Plan: General   Post-op Pain Management:    Induction: Intravenous  PONV Risk Score and Plan: 2 and Ondansetron and TIVA  Airway Management Planned: LMA  Additional Equipment:   Intra-op Plan:   Post-operative Plan: Extubation in OR  Informed Consent: I have reviewed the patients History and Physical, chart, labs and discussed the procedure including the risks, benefits and alternatives for the proposed anesthesia with the patient or authorized representative who has indicated his/her understanding and acceptance.     Dental advisory given  Plan Discussed with: CRNA  Anesthesia Plan Comments: (MH precautions )       Anesthesia Quick Evaluation

## 2023-07-24 NOTE — Interval H&P Note (Signed)
History and Physical Interval Note:  07/24/2023 7:54 AM  Vanessa Carr  has presented today for surgery, with the diagnosis of LEFT BREAST CANCER.  The various methods of treatment have been discussed with the patient and family. After consideration of risks, benefits and other options for treatment, the patient has consented to  Procedure(s): RE-EXCISION OF LEFT BREAST SUPERIOR MARGIN (Left) as a surgical intervention.  The patient's history has been reviewed, patient examined, no change in status, stable for surgery.  I have reviewed the patient's chart and labs.  Questions were answered to the patient's satisfaction.     Chevis Pretty III

## 2023-07-25 ENCOUNTER — Encounter (HOSPITAL_BASED_OUTPATIENT_CLINIC_OR_DEPARTMENT_OTHER): Payer: Self-pay | Admitting: General Surgery

## 2023-07-25 LAB — SURGICAL PATHOLOGY

## 2023-07-28 ENCOUNTER — Encounter: Payer: Self-pay | Admitting: *Deleted

## 2023-07-28 ENCOUNTER — Ambulatory Visit (HOSPITAL_COMMUNITY): Payer: Medicaid Other

## 2023-07-28 ENCOUNTER — Telehealth: Payer: Self-pay | Admitting: *Deleted

## 2023-07-28 ENCOUNTER — Encounter (HOSPITAL_COMMUNITY): Payer: Self-pay

## 2023-07-28 DIAGNOSIS — C50412 Malignant neoplasm of upper-outer quadrant of left female breast: Secondary | ICD-10-CM

## 2023-07-28 NOTE — Telephone Encounter (Signed)
 Received oncotype results of 16/4. Referral placed for Dr. Basilio Cairo.

## 2023-07-29 ENCOUNTER — Encounter: Payer: Self-pay | Admitting: General Surgery

## 2023-07-29 ENCOUNTER — Telehealth: Payer: Self-pay | Admitting: Radiation Oncology

## 2023-07-29 NOTE — Telephone Encounter (Signed)
Called patient to schedule a consultation w. Dr. Squire. No answer, unable to LVM, VM not setup.  

## 2023-07-31 NOTE — Therapy (Signed)
OUTPATIENT PHYSICAL THERAPY BREAST CANCER POST OP FOLLOW UP   Patient Name: Vanessa Carr MRN: [id number] DOB:02-Jun-1971, 53 y.o., female Today's Date: 08/01/2023  END OF SESSION:  PT End of Session - 08/01/23 0843     Visit Number 2    Number of Visits 2    Date for PT Re-Evaluation 08/01/23    PT Start Time 0800    PT Stop Time 0835    PT Time Calculation (min) 35 min    Activity Tolerance Patient tolerated treatment well    Behavior During Therapy WFL for tasks assessed/performed             Past Medical History:  Diagnosis Date   Anxiety    Arthritis    low back pain, legs   Asthma    At risk for malignant hyperthermia    [place]   Cancer [facility])    left breast IDC   Depression    Dyslipidemia    Hypertension    Past Surgical History:  Procedure Laterality Date   BREAST BIOPSY Left 06/20/2023   [place] LT BREAST BX W LOC DEV 1ST LESION IMG BX SPEC US GUIDE 06/20/2023 [facility]   BREAST BIOPSY  07/10/2023   MM LT RADIOACTIVE SEED LOC MAMMO GUIDE 07/10/2023 [facility]   BREAST LUMPECTOMY WITH RADIOACTIVE SEED AND SENTINEL LYMPH NODE BIOPSY Left 07/11/2023   Procedure: LEFT BREAST RADIOACTIVE SEED LOCALIZED LUMPECTOMY AND SENTINEL NODE BIOPSY;  Surgeon: Griselda Miner, MD;  Location: [facility];  Service: General;  Laterality: Left;  PEC BLOCK   RE-EXCISION OF BREAST LUMPECTOMY Left 07/24/2023   Procedure: RE-EXCISION OF LEFT BREAST SUPERIOR MARGIN;  Surgeon: Griselda Miner, MD;  Location: [facility];  Service: General;  Laterality: Left;   WISDOM TOOTH EXTRACTION     Patient Active Problem List   Diagnosis Date Noted   At risk for malignant hyperthermia    Malignant neoplasm of upper-outer quadrant of left breast in female, estrogen receptor positive (HCC) 07/04/2023   Abnormal transaminases 06/02/2012   Pyelonephritis 05/31/2012   Hypokalemia 05/31/2012    PCP: Milus Height, PA  REFERRING PROVIDER: Dr. Chevis Pretty  REFERRING DIAG:  Diagnosis  C50.412,Z17.0 (ICD-10-CM) - Malignant neoplasm of upper-outer quadrant of left breast in female, estrogen receptor positive (HCC)    THERAPY DIAG:  Malignant neoplasm of upper-outer quadrant of left breast in female, estrogen receptor positive (HCC)  Pain in right arm  At risk for lymphedema  Aftercare following surgery for neoplasm  Rationale for Evaluation and Treatment: Rehabilitation  ONSET DATE: 06/27/23  SUBJECTIVE:  SUBJECTIVE STATEMENT: I had some terrible terrible pain in the underarm, that is better now.  Now with the second surgery the breast is really painful.    PERTINENT HISTORY:  Patient was diagnosed with left grade 2 IDC. It measures 8mm. It is ER/PR positive and HER2 neg with a Ki67 of 15%. Lumpectomy with SLNB on 07/11/23 with 3 negative nodes removed. Re-excision on 07/24/23.   PATIENT GOALS:  Reassess how my recovery is going related to arm function, pain, and swelling.  PAIN:  Are you having pain? 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  I am doing okay.  I am just not mopping.     OBJECTIVE:   PATIENT SURVEYS:  QUICK DASH: 45% From 34%  OBSERVATIONS: Using pillow still.  Well healed incisions - glue still present over new re-excision scar.  No signs of infection.    POSTURE:  Guarded today   LYMPHEDEMA ASSESSMENT:   UPPER EXTREMITY AROM/PROM:   A/PROM RIGHT   eval    Shoulder extension 35  Shoulder flexion 148  Shoulder  abduction 145  Shoulder internal rotation    Shoulder external rotation 75                          (Blank rows = not tested)   A/PROM LEFT   eval 08/01/23  Shoulder extension 47 50  Shoulder flexion 152 145  Shoulder abduction 155 110 - tight breast  Shoulder internal rotation     Shoulder external rotation 80 80                          (Blank rows = not tested)   CERVICAL AROM: All within normal limits:    UPPER EXTREMITY STRENGTH: Lt: 4/5 without pain and Rt:  4+/5   LYMPHEDEMA ASSESSMENTS (in cm):    LANDMARK RIGHT   eval  10 cm proximal to olecranon process 36.8  Olecranon process 31.5  10 cm proximal to ulnar styloid process 24.3  Just proximal to ulnar styloid process 18.5  Across hand at thumb web space 20.5  At base of 2nd digit 7.3  (Blank rows = not tested)   LANDMARK LEFT   eval 08/01/23  10 cm proximal to olecranon process 37.7 37.5  Olecranon process 31.2 31.2  10 cm proximal to ulnar styloid process 25.3 25.1  Just proximal to ulnar styloid process 18.8 18.4  Across hand at thumb web space 21.5 21  At base of 2nd digit 7.3 7.3  (Blank rows = not tested)   PATIENT EDUCATION:  Education details: review of HEP, lymphedema risk reduction review, ABC class, post op paper Person educated: Patient Education method: Explanation and Handouts Education comprehension: verbalized understanding  HOME EXERCISE PROGRAM: Reviewed previously given post op HEP.   ASSESSMENT:  CLINICAL IMPRESSION: Pt is only 1.5 weeks after reexcision and moving well except for soreness in the breast.  She will continue SOZO screenings and knows to return with any more issues getting the rest of her motion back.   Pt will benefit from skilled therapeutic intervention to improve on the following deficits: Decreased knowledge of precautions, impaired UE functional use,  pain, decreased ROM, postural dysfunction.    GOALS: Goals reviewed with patient? Yes  LONG TERM GOALS:   (STG=LTG)  GOALS Name Target Date  Goal status  1 Pt will demonstrate she has regained full shoulder ROM and function post operatively compared to baselines.  Baseline: 08/01/23 ONGOING                    PLAN:  PT FREQUENCY/DURATION: as needed and SOZO  PLAN FOR NEXT SESSION: as needed and SOZO   [facility]  [address], [address]  [place] [address]  [phone]  After Breast Cancer Class It is recommended you attend the ABC class to be educated on lymphedema risk reduction. This class is free of charge and lasts for 1 hour. It is a 1-time class. You will need to download the TEAMS app either on your phone or computer. We will send you a link the night before or the morning of the class. You should be able to click on that link to join the class. This is not a confidential class. You don't have to turn your camera on, but other participants may be able to see your email address.  Scar massage You can begin gentle scar massage to you incision sites. Gently place one hand on the incision and move the skin (without sliding on the skin) in various directions. Do this for a few minutes and then you can gently massage either coconut oil or vitamin E cream into the scars.  Compression garment You should continue wearing your compression bra until you feel like you no longer have swelling.  Home exercise Program Continue doing the exercises you were given until you feel like you can do them without feeling any tightness at the end.   Walking Program Studies show that 30 minutes of walking per day (fast enough to elevate your heart rate) can significantly reduce the risk of a cancer recurrence. If you can't walk due to other medical reasons, we encourage you to find another activity you could do (like a stationary bike or water exercise).  Posture After breast cancer surgery, people frequently sit with rounded shoulders posture because it puts their  incisions on slack and feels better. If you sit like this and scar tissue forms in that position, you can become very tight and have pain sitting or standing with good posture. Try to be aware of your posture and sit and stand up tall to heal properly.  Follow up PT: It is recommended you return every 3 months for the first 3 years following surgery to be assessed on the SOZO machine for an L-Dex score. This helps prevent clinically significant lymphedema in 95% of patients. These follow up screens are 10 minute appointments that you are not billed for.  Idamae Lusher, PT 08/01/2023, 8:46 AM  PHYSICAL THERAPY DISCHARGE SUMMARY  Visits from Start of Care: 2  Current functional level related to goals / functional outcomes: See above   Remaining deficits: Lymphedema risk   Education / Equipment: HEP  Plan: Patient agrees to discharge.

## 2023-08-01 ENCOUNTER — Ambulatory Visit: Payer: Medicaid Other | Attending: General Surgery | Admitting: Rehabilitation

## 2023-08-01 ENCOUNTER — Encounter: Payer: Self-pay | Admitting: Rehabilitation

## 2023-08-01 DIAGNOSIS — M79601 Pain in right arm: Secondary | ICD-10-CM | POA: Insufficient documentation

## 2023-08-01 DIAGNOSIS — Z9189 Other specified personal risk factors, not elsewhere classified: Secondary | ICD-10-CM | POA: Diagnosis present

## 2023-08-01 DIAGNOSIS — C50412 Malignant neoplasm of upper-outer quadrant of left female breast: Secondary | ICD-10-CM | POA: Diagnosis present

## 2023-08-01 DIAGNOSIS — Z170 Estrogen receptor positive status [ER+]: Secondary | ICD-10-CM | POA: Diagnosis present

## 2023-08-01 DIAGNOSIS — Z483 Aftercare following surgery for neoplasm: Secondary | ICD-10-CM | POA: Insufficient documentation

## 2023-08-04 ENCOUNTER — Encounter: Payer: Self-pay | Admitting: *Deleted

## 2023-08-15 NOTE — Progress Notes (Signed)
Location of Breast Cancer: Malignant neoplasm of upper-outer quadrant of left breast, estrogen receptor positive.   Histology per Pathology Report:    Receptor Status: ER(Positive), PR (Positive), Her2-neu (Negative)  Did patient present with symptoms (if so, please note symptoms) or was this found on screening mammography?:  Dr. Mosetta Putt 07/18/2023 Abnormal mammogram and subsequent biopsy. The patient did not feel the lump herself, indicating it was small at the time of detection. Results from Mammogram  Past/Anticipated interventions by surgeon, if any:  Dr. Carolynne Edouard 07/24/2023 Left Breast Radioactive Seed Lumpectomy  Past/Anticipated interventions by medical oncology, if any:  Dr. Mosetta Putt 07/18/2023    Lymphedema issues, if any: She was seen by her surgeon 1.5 week ago and has minimal swelling to the underarm.      Pain issues, if any: She reports occasional sharp pain in the surgical field.    SAFETY ISSUES: Prior radiation? No Pacemaker/ICD? No Possible current pregnancy? Regular cycles, No birth control, not sexually active at this time. Is the patient on methotrexate? No  Current Complaints / other details:

## 2023-08-24 NOTE — Progress Notes (Signed)
Radiation Oncology         [phone]) [phone] ________________________________  Name: Vanessa Carr MRN: [id number]  Date: 08/25/2023  DOB: 05-Dec-1970  Follow-Up Visit Note  Outpatient  CC: Irven Coe, MD  Malachy Mood, MD  Diagnosis:   No diagnosis found.   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     Since her consultation date of 07/04/23, she opted to proceed with a left breast lumpectomy with left axillary SLN excisions on 07/11/23 under the care of Dr. Carolynne Edouard.  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  Oncotype DX was  obtained on the final surgical sample which showed a recurrence score of 16, which predicts a risk of recurrence outside the breast over the next 9 years of 4%, if the patient's only systemic therapy were to be an antiestrogen for 5 years.  It also predicts no significant benefit from chemotherapy.  She accordingly underwent re-excision to obtain clean margins on 07/24/23. Pathology showed no evidence of residual DCIS in the final superior margin.   Post-operatively, she did develop some breast and axillary pain which are both improving gradually (managed with tylenol and Neurontin).   She was seen in consultation by Dr. Mosetta Putt on 07/18/23 and has agreed to proceed with antiestrogen therapy consisting of tamoxifen following XRT. In the setting of her pre-menopausal status, Dr. Mosetta Putt may consider her for an AI if she becomes menopausal while on tamoxifen.   Symptomatically, the patient reports: ***        ALLERGIES:  is allergic to sevoflurane.  Meds: Current Outpatient Medications  Medication Sig Dispense Refill   albuterol (VENTOLIN HFA) 108 (90 Base) MCG/ACT inhaler 2 puffs every 6 (six) hours as needed for wheezing.     Diclofenac Sodium 3 % GEL Apply 1 Application topically 2 (two) times daily.     FEROSUL 325 (65 Fe) MG tablet Take 325 mg by mouth every other day.     gabapentin (NEURONTIN) 100 MG capsule Take 1 capsule (100 mg total) by mouth 3 (three) times daily. 90 capsule 2   ibuprofen (ADVIL,MOTRIN) 800 MG tablet Take 1 tablet (800 mg total) by mouth 3 (three) times  daily. 21 tablet 0   lisinopril-hydrochlorothiazide (ZESTORETIC) 20-25 MG tablet Take 1 tablet by mouth daily.     LORazepam (ATIVAN) 0.5 MG tablet 0.5 mg as needed.     meloxicam (MOBIC) 15 MG tablet Take 15 mg by mouth as needed.     oxyCODONE (ROXICODONE) 5 MG immediate release tablet Take 1 tablet (5 mg total) by mouth every 6 (six) hours as needed for severe pain (pain score 7-10). 15 tablet 0   oxyCODONE (ROXICODONE)  5 MG immediate release tablet Take 1 tablet (5 mg total) by mouth every 6 (six) hours as needed for severe pain (pain score 7-10). 10 tablet 0   polyethylene glycol powder (GLYCOLAX/MIRALAX) 17 GM/SCOOP powder Take 17 g by mouth daily.     rOPINIRole (REQUIP) 3 MG tablet Take 3 mg by mouth at bedtime.     traMADol (ULTRAM) 50 MG tablet Take 50 mg by mouth as needed.     venlafaxine XR (EFFEXOR XR) 37.5 MG 24 hr capsule Take 1 capsule (37.5 mg total) by mouth daily. 30 capsule 1   WEGOVY 0.25 MG/0.5ML SOAJ Inject 0.25 mg into the skin once a week.     No current facility-administered medications for this encounter.    Physical Findings:  vitals were not taken for this visit. .     General: Alert and oriented, in no acute distress HEENT: Head is normocephalic. Extraocular movements are intact. Oropharynx is clear. Neck: Neck is supple, no palpable cervical or supraclavicular lymphadenopathy. Heart: Regular in rate and rhythm with no murmurs, rubs, or gallops. Chest: Clear to auscultation bilaterally, with no rhonchi, wheezes, or rales. Abdomen: Soft, nontender, nondistended, with no rigidity or guarding. Extremities: No cyanosis or edema. Lymphatics: see Neck Exam Musculoskeletal: symmetric strength and muscle tone throughout. Neurologic: No obvious focalities. Speech is fluent.  Psychiatric: Judgment and insight are intact. Affect is appropriate. Breast exam reveals ***  Lab Findings: Lab Results  Component Value Date   WBC 8.8 09/08/2015   HGB 13.2 09/08/2015   HCT 39.3 09/08/2015   MCV 89.7 09/08/2015   PLT 86 (L) 09/08/2015    @LASTCHEMISTRY @  Radiographic Findings: No results found.  Impression/Plan: We discussed adjuvant radiotherapy today.  I recommend *** in order to ***.  I reviewed the logistics, benefits, risks, and potential side effects of this treatment in detail. Risks may include but not necessary be limited to acute and late injury tissue in the radiation  fields such as skin irritation (change in color/pigmentation, itching, dryness, pain, peeling). She may experience fatigue. We also discussed possible risk of long term cosmetic changes or scar tissue. There is also a smaller risk for lung toxicity, ***cardiac toxicity, ***brachial plexopathy, ***lymphedema, ***musculoskeletal changes, ***rib fragility or ***induction of a second malignancy, ***late chronic non-healing soft tissue wound.    The patient asked good questions which I answered to her satisfaction. She is enthusiastic about proceeding with treatment. A consent form has been *** signed and placed in her chart.  A total of *** medically necessary complex treatment devices will be fabricated and supervised by me: *** fields with MLCs for custom blocks to protect heart, and lungs;  and, a Vac-lok. MORE COMPLEX DEVICES MAY BE MADE IN DOSIMETRY FOR FIELD IN FIELD BEAMS FOR DOSE HOMOGENEITY.  I have requested : 3D Simulation which is medically necessary to give adequate dose to at risk tissues while sparing lungs and heart.  I have requested a DVH of the following structures: lungs, heart, *** lumpectomy  cavity.    The patient will receive *** Gy in *** fractions to the *** with *** fields.  This will be *** followed by a boost.  On date of service, in total, I spent *** minutes on this encounter. Patient was seen in person.  _____________________________________   Lonie Peak, MD  This document serves as a record of services personally performed by Lonie Peak, MD. It was created on her behalf by Neena Rhymes, a trained medical scribe. The creation of this record is based on the scribe's personal observations and the provider's statements to them. This document has been checked and approved by the attending provider.

## 2023-08-25 ENCOUNTER — Ambulatory Visit
Admission: RE | Admit: 2023-08-25 | Discharge: 2023-08-25 | Disposition: A | Discharge status: Home / Self Care | Payer: Medicaid Other | Source: Ambulatory Visit | Attending: Radiation Oncology | Admitting: Radiation Oncology

## 2023-08-25 ENCOUNTER — Other Ambulatory Visit: Payer: Self-pay

## 2023-08-25 ENCOUNTER — Telehealth: Payer: Self-pay | Admitting: *Deleted

## 2023-08-25 ENCOUNTER — Encounter: Payer: Self-pay | Admitting: Radiation Oncology

## 2023-08-25 VITALS — BP 124/94 | HR 84 | Temp 97.3°F | Resp 18 | Ht 68.0 in | Wt 238.2 lb

## 2023-08-25 DIAGNOSIS — C50412 Malignant neoplasm of upper-outer quadrant of left female breast: Secondary | ICD-10-CM | POA: Insufficient documentation

## 2023-08-25 DIAGNOSIS — Z170 Estrogen receptor positive status [ER+]: Secondary | ICD-10-CM | POA: Diagnosis not present

## 2023-08-25 DIAGNOSIS — Z79899 Other long term (current) drug therapy: Secondary | ICD-10-CM | POA: Diagnosis not present

## 2023-08-25 LAB — PREGNANCY, URINE: Preg Test, Ur: NEGATIVE

## 2023-08-25 NOTE — Telephone Encounter (Signed)
CALLED PATIENT TO ASK WHAT TIME SHE WANTS TO COME FOR LABS, PATIENT AGREED TO COME @ 2 PM, APPT. HAS BEEN BOOKED

## 2023-08-28 ENCOUNTER — Encounter: Payer: Self-pay | Admitting: *Deleted

## 2023-08-28 DIAGNOSIS — C50412 Malignant neoplasm of upper-outer quadrant of left female breast: Secondary | ICD-10-CM

## 2023-09-03 ENCOUNTER — Ambulatory Visit: Payer: Medicaid Other | Admitting: Radiation Oncology

## 2023-09-03 DIAGNOSIS — C50412 Malignant neoplasm of upper-outer quadrant of left female breast: Secondary | ICD-10-CM | POA: Diagnosis not present

## 2023-09-04 ENCOUNTER — Ambulatory Visit: Payer: Medicaid Other

## 2023-09-05 ENCOUNTER — Ambulatory Visit: Payer: Medicaid Other

## 2023-09-08 ENCOUNTER — Ambulatory Visit: Payer: Medicaid Other

## 2023-09-09 ENCOUNTER — Ambulatory Visit: Payer: Medicaid Other

## 2023-09-10 ENCOUNTER — Ambulatory Visit
Admission: RE | Admit: 2023-09-10 | Discharge: 2023-09-10 | Disposition: A | Discharge status: Home / Self Care | Payer: Medicaid Other | Source: Ambulatory Visit | Attending: Radiation Oncology | Admitting: Radiation Oncology

## 2023-09-10 ENCOUNTER — Ambulatory Visit
Admission: RE | Admit: 2023-09-10 | Discharge: 2023-09-10 | Discharge status: Home / Self Care | Source: Ambulatory Visit | Attending: Radiation Oncology

## 2023-09-10 ENCOUNTER — Other Ambulatory Visit: Payer: Self-pay

## 2023-09-10 DIAGNOSIS — Z170 Estrogen receptor positive status [ER+]: Secondary | ICD-10-CM | POA: Insufficient documentation

## 2023-09-10 DIAGNOSIS — C50412 Malignant neoplasm of upper-outer quadrant of left female breast: Secondary | ICD-10-CM | POA: Diagnosis present

## 2023-09-10 LAB — RAD ONC ARIA SESSION SUMMARY
Course Elapsed Days: 0
Plan Fractions Treated to Date: 1
Plan Prescribed Dose Per Fraction: 2.67 Gy
Plan Total Fractions Prescribed: 15
Plan Total Prescribed Dose: 40.05 Gy
Reference Point Dosage Given to Date: 2.67 Gy
Reference Point Session Dosage Given: 2.67 Gy
Session Number: 1

## 2023-09-11 ENCOUNTER — Other Ambulatory Visit: Payer: Self-pay

## 2023-09-11 ENCOUNTER — Ambulatory Visit
Admission: RE | Admit: 2023-09-11 | Discharge: 2023-09-11 | Disposition: A | Discharge status: Home / Self Care | Payer: Medicaid Other | Source: Ambulatory Visit | Attending: Radiation Oncology | Admitting: Radiation Oncology

## 2023-09-11 DIAGNOSIS — C50412 Malignant neoplasm of upper-outer quadrant of left female breast: Secondary | ICD-10-CM | POA: Diagnosis not present

## 2023-09-11 LAB — RAD ONC ARIA SESSION SUMMARY
Course Elapsed Days: 1
Plan Fractions Treated to Date: 2
Plan Prescribed Dose Per Fraction: 2.67 Gy
Plan Total Fractions Prescribed: 15
Plan Total Prescribed Dose: 40.05 Gy
Reference Point Dosage Given to Date: 5.34 Gy
Reference Point Session Dosage Given: 2.67 Gy
Session Number: 2

## 2023-09-12 ENCOUNTER — Other Ambulatory Visit: Payer: Self-pay

## 2023-09-12 ENCOUNTER — Ambulatory Visit
Admission: RE | Admit: 2023-09-12 | Discharge: 2023-09-12 | Disposition: A | Discharge status: Home / Self Care | Payer: Medicaid Other | Source: Ambulatory Visit | Attending: Radiation Oncology | Admitting: Radiation Oncology

## 2023-09-12 DIAGNOSIS — C50412 Malignant neoplasm of upper-outer quadrant of left female breast: Secondary | ICD-10-CM | POA: Diagnosis not present

## 2023-09-12 LAB — RAD ONC ARIA SESSION SUMMARY
Course Elapsed Days: 2
Plan Fractions Treated to Date: 3
Plan Prescribed Dose Per Fraction: 2.67 Gy
Plan Total Fractions Prescribed: 15
Plan Total Prescribed Dose: 40.05 Gy
Reference Point Dosage Given to Date: 8.01 Gy
Reference Point Session Dosage Given: 2.67 Gy
Session Number: 3

## 2023-09-15 ENCOUNTER — Ambulatory Visit
Admission: RE | Admit: 2023-09-15 | Discharge: 2023-09-15 | Disposition: A | Discharge status: Home / Self Care | Payer: Medicaid Other | Source: Ambulatory Visit | Attending: Radiation Oncology

## 2023-09-15 ENCOUNTER — Other Ambulatory Visit: Payer: Self-pay

## 2023-09-15 DIAGNOSIS — C50412 Malignant neoplasm of upper-outer quadrant of left female breast: Secondary | ICD-10-CM | POA: Diagnosis not present

## 2023-09-15 LAB — RAD ONC ARIA SESSION SUMMARY
Course Elapsed Days: 5
Plan Fractions Treated to Date: 4
Plan Prescribed Dose Per Fraction: 2.67 Gy
Plan Total Fractions Prescribed: 15
Plan Total Prescribed Dose: 40.05 Gy
Reference Point Dosage Given to Date: 10.68 Gy
Reference Point Session Dosage Given: 2.67 Gy
Session Number: 4

## 2023-09-16 ENCOUNTER — Other Ambulatory Visit: Payer: Self-pay

## 2023-09-16 ENCOUNTER — Telehealth: Payer: Self-pay | Admitting: Nurse Practitioner

## 2023-09-16 ENCOUNTER — Ambulatory Visit
Admission: RE | Admit: 2023-09-16 | Discharge: 2023-09-16 | Disposition: A | Discharge status: Home / Self Care | Payer: Medicaid Other | Source: Ambulatory Visit | Attending: Radiation Oncology

## 2023-09-16 DIAGNOSIS — C50412 Malignant neoplasm of upper-outer quadrant of left female breast: Secondary | ICD-10-CM | POA: Diagnosis not present

## 2023-09-16 LAB — RAD ONC ARIA SESSION SUMMARY
Course Elapsed Days: 6
Plan Fractions Treated to Date: 5
Plan Prescribed Dose Per Fraction: 2.67 Gy
Plan Total Fractions Prescribed: 15
Plan Total Prescribed Dose: 40.05 Gy
Reference Point Dosage Given to Date: 13.35 Gy
Reference Point Session Dosage Given: 2.67 Gy
Session Number: 5

## 2023-09-17 ENCOUNTER — Ambulatory Visit
Admission: RE | Admit: 2023-09-17 | Discharge: 2023-09-17 | Disposition: A | Discharge status: Home / Self Care | Payer: Medicaid Other | Source: Ambulatory Visit | Attending: Radiation Oncology | Admitting: Radiation Oncology

## 2023-09-17 ENCOUNTER — Other Ambulatory Visit: Payer: Self-pay

## 2023-09-17 DIAGNOSIS — C50412 Malignant neoplasm of upper-outer quadrant of left female breast: Secondary | ICD-10-CM | POA: Diagnosis not present

## 2023-09-17 LAB — RAD ONC ARIA SESSION SUMMARY
Course Elapsed Days: 7
Plan Fractions Treated to Date: 6
Plan Prescribed Dose Per Fraction: 2.67 Gy
Plan Total Fractions Prescribed: 15
Plan Total Prescribed Dose: 40.05 Gy
Reference Point Dosage Given to Date: 16.02 Gy
Reference Point Session Dosage Given: 2.67 Gy
Session Number: 6

## 2023-09-18 ENCOUNTER — Other Ambulatory Visit: Payer: Self-pay

## 2023-09-18 ENCOUNTER — Ambulatory Visit
Admission: RE | Admit: 2023-09-18 | Discharge: 2023-09-18 | Disposition: A | Discharge status: Home / Self Care | Payer: Medicaid Other | Source: Ambulatory Visit | Attending: Radiation Oncology | Admitting: Radiation Oncology

## 2023-09-18 DIAGNOSIS — C50412 Malignant neoplasm of upper-outer quadrant of left female breast: Secondary | ICD-10-CM | POA: Diagnosis not present

## 2023-09-18 LAB — RAD ONC ARIA SESSION SUMMARY
Course Elapsed Days: 8
Plan Fractions Treated to Date: 7
Plan Prescribed Dose Per Fraction: 2.67 Gy
Plan Total Fractions Prescribed: 15
Plan Total Prescribed Dose: 40.05 Gy
Reference Point Dosage Given to Date: 18.69 Gy
Reference Point Session Dosage Given: 2.67 Gy
Session Number: 7

## 2023-09-19 ENCOUNTER — Other Ambulatory Visit: Payer: Self-pay

## 2023-09-19 ENCOUNTER — Ambulatory Visit
Admission: RE | Admit: 2023-09-19 | Discharge: 2023-09-19 | Disposition: A | Discharge status: Home / Self Care | Payer: Medicaid Other | Source: Ambulatory Visit | Attending: Radiation Oncology | Admitting: Radiation Oncology

## 2023-09-19 DIAGNOSIS — C50412 Malignant neoplasm of upper-outer quadrant of left female breast: Secondary | ICD-10-CM | POA: Diagnosis not present

## 2023-09-19 LAB — RAD ONC ARIA SESSION SUMMARY
Course Elapsed Days: 9
Plan Fractions Treated to Date: 8
Plan Prescribed Dose Per Fraction: 2.67 Gy
Plan Total Fractions Prescribed: 15
Plan Total Prescribed Dose: 40.05 Gy
Reference Point Dosage Given to Date: 21.36 Gy
Reference Point Session Dosage Given: 2.67 Gy
Session Number: 8

## 2023-09-22 ENCOUNTER — Ambulatory Visit: Payer: Medicaid Other | Admitting: Radiation Oncology

## 2023-09-22 ENCOUNTER — Other Ambulatory Visit: Payer: Self-pay

## 2023-09-22 ENCOUNTER — Ambulatory Visit

## 2023-09-22 ENCOUNTER — Ambulatory Visit
Admission: RE | Admit: 2023-09-22 | Discharge: 2023-09-22 | Disposition: A | Discharge status: Home / Self Care | Payer: Medicaid Other | Source: Ambulatory Visit | Attending: Radiation Oncology

## 2023-09-22 DIAGNOSIS — C50412 Malignant neoplasm of upper-outer quadrant of left female breast: Secondary | ICD-10-CM | POA: Diagnosis not present

## 2023-09-22 LAB — RAD ONC ARIA SESSION SUMMARY
Course Elapsed Days: 12
Plan Fractions Treated to Date: 9
Plan Prescribed Dose Per Fraction: 2.67 Gy
Plan Total Fractions Prescribed: 15
Plan Total Prescribed Dose: 40.05 Gy
Reference Point Dosage Given to Date: 24.03 Gy
Reference Point Session Dosage Given: 2.67 Gy
Session Number: 9

## 2023-09-23 ENCOUNTER — Ambulatory Visit
Admission: RE | Admit: 2023-09-23 | Discharge: 2023-09-23 | Disposition: A | Discharge status: Home / Self Care | Payer: Medicaid Other | Source: Ambulatory Visit | Attending: Radiation Oncology

## 2023-09-23 ENCOUNTER — Other Ambulatory Visit: Payer: Self-pay

## 2023-09-23 DIAGNOSIS — C50412 Malignant neoplasm of upper-outer quadrant of left female breast: Secondary | ICD-10-CM | POA: Diagnosis not present

## 2023-09-23 LAB — RAD ONC ARIA SESSION SUMMARY
Course Elapsed Days: 13
Plan Fractions Treated to Date: 10
Plan Prescribed Dose Per Fraction: 2.67 Gy
Plan Total Fractions Prescribed: 15
Plan Total Prescribed Dose: 40.05 Gy
Reference Point Dosage Given to Date: 26.7 Gy
Reference Point Session Dosage Given: 2.67 Gy
Session Number: 10

## 2023-09-24 ENCOUNTER — Ambulatory Visit
Admission: RE | Admit: 2023-09-24 | Discharge: 2023-09-24 | Disposition: A | Discharge status: Home / Self Care | Payer: Medicaid Other | Source: Ambulatory Visit | Attending: Radiation Oncology | Admitting: Radiation Oncology

## 2023-09-24 ENCOUNTER — Other Ambulatory Visit: Payer: Self-pay

## 2023-09-24 ENCOUNTER — Ambulatory Visit: Payer: Medicaid Other | Admitting: Radiation Oncology

## 2023-09-24 DIAGNOSIS — C50412 Malignant neoplasm of upper-outer quadrant of left female breast: Secondary | ICD-10-CM | POA: Diagnosis not present

## 2023-09-24 LAB — RAD ONC ARIA SESSION SUMMARY
Course Elapsed Days: 14
Plan Fractions Treated to Date: 11
Plan Prescribed Dose Per Fraction: 2.67 Gy
Plan Total Fractions Prescribed: 15
Plan Total Prescribed Dose: 40.05 Gy
Reference Point Dosage Given to Date: 29.37 Gy
Reference Point Session Dosage Given: 2.67 Gy
Session Number: 11

## 2023-09-25 ENCOUNTER — Ambulatory Visit
Admission: RE | Admit: 2023-09-25 | Discharge: 2023-09-25 | Disposition: A | Discharge status: Home / Self Care | Payer: Medicaid Other | Source: Ambulatory Visit | Attending: Radiation Oncology | Admitting: Radiation Oncology

## 2023-09-25 ENCOUNTER — Other Ambulatory Visit: Payer: Self-pay

## 2023-09-25 ENCOUNTER — Ambulatory Visit: Payer: Medicaid Other

## 2023-09-25 DIAGNOSIS — C50412 Malignant neoplasm of upper-outer quadrant of left female breast: Secondary | ICD-10-CM | POA: Diagnosis not present

## 2023-09-25 LAB — RAD ONC ARIA SESSION SUMMARY
Course Elapsed Days: 15
Plan Fractions Treated to Date: 12
Plan Prescribed Dose Per Fraction: 2.67 Gy
Plan Total Fractions Prescribed: 15
Plan Total Prescribed Dose: 40.05 Gy
Reference Point Dosage Given to Date: 32.04 Gy
Reference Point Session Dosage Given: 2.67 Gy
Session Number: 12

## 2023-09-26 ENCOUNTER — Ambulatory Visit
Admission: RE | Admit: 2023-09-26 | Discharge: 2023-09-26 | Disposition: A | Discharge status: Home / Self Care | Payer: Medicaid Other | Source: Ambulatory Visit | Attending: Radiation Oncology | Admitting: Radiation Oncology

## 2023-09-26 ENCOUNTER — Ambulatory Visit: Payer: Medicaid Other

## 2023-09-26 ENCOUNTER — Other Ambulatory Visit: Payer: Self-pay

## 2023-09-26 DIAGNOSIS — C50412 Malignant neoplasm of upper-outer quadrant of left female breast: Secondary | ICD-10-CM | POA: Diagnosis not present

## 2023-09-26 LAB — RAD ONC ARIA SESSION SUMMARY
Course Elapsed Days: 16
Plan Fractions Treated to Date: 13
Plan Prescribed Dose Per Fraction: 2.67 Gy
Plan Total Fractions Prescribed: 15
Plan Total Prescribed Dose: 40.05 Gy
Reference Point Dosage Given to Date: 34.71 Gy
Reference Point Session Dosage Given: 2.67 Gy
Session Number: 13

## 2023-09-29 ENCOUNTER — Ambulatory Visit
Admission: RE | Admit: 2023-09-29 | Discharge: 2023-09-29 | Disposition: A | Discharge status: Home / Self Care | Payer: Medicaid Other | Source: Ambulatory Visit | Attending: Radiation Oncology

## 2023-09-29 ENCOUNTER — Ambulatory Visit: Payer: Medicaid Other

## 2023-09-29 ENCOUNTER — Ambulatory Visit

## 2023-09-29 ENCOUNTER — Ambulatory Visit: Payer: Medicaid Other | Admitting: Radiation Oncology

## 2023-09-29 ENCOUNTER — Other Ambulatory Visit: Payer: Self-pay

## 2023-09-29 DIAGNOSIS — C50412 Malignant neoplasm of upper-outer quadrant of left female breast: Secondary | ICD-10-CM | POA: Diagnosis not present

## 2023-09-29 LAB — RAD ONC ARIA SESSION SUMMARY
Course Elapsed Days: 19
Plan Fractions Treated to Date: 14
Plan Prescribed Dose Per Fraction: 2.67 Gy
Plan Total Fractions Prescribed: 15
Plan Total Prescribed Dose: 40.05 Gy
Reference Point Dosage Given to Date: 37.38 Gy
Reference Point Session Dosage Given: 2.67 Gy
Session Number: 14

## 2023-09-30 ENCOUNTER — Ambulatory Visit: Payer: Medicaid Other

## 2023-09-30 ENCOUNTER — Other Ambulatory Visit: Payer: Self-pay

## 2023-09-30 ENCOUNTER — Ambulatory Visit
Admission: RE | Admit: 2023-09-30 | Discharge: 2023-09-30 | Disposition: A | Discharge status: Home / Self Care | Payer: Medicaid Other | Source: Ambulatory Visit | Attending: Radiation Oncology

## 2023-09-30 DIAGNOSIS — C50412 Malignant neoplasm of upper-outer quadrant of left female breast: Secondary | ICD-10-CM | POA: Diagnosis not present

## 2023-09-30 LAB — RAD ONC ARIA SESSION SUMMARY
Course Elapsed Days: 20
Plan Fractions Treated to Date: 15
Plan Prescribed Dose Per Fraction: 2.67 Gy
Plan Total Fractions Prescribed: 15
Plan Total Prescribed Dose: 40.05 Gy
Reference Point Dosage Given to Date: 40.05 Gy
Reference Point Session Dosage Given: 2.67 Gy
Session Number: 15

## 2023-10-01 ENCOUNTER — Other Ambulatory Visit: Payer: Self-pay

## 2023-10-01 ENCOUNTER — Ambulatory Visit
Admission: RE | Admit: 2023-10-01 | Discharge: 2023-10-01 | Disposition: A | Discharge status: Home / Self Care | Payer: Medicaid Other | Source: Ambulatory Visit | Attending: Radiation Oncology | Admitting: Radiation Oncology

## 2023-10-01 DIAGNOSIS — C50412 Malignant neoplasm of upper-outer quadrant of left female breast: Secondary | ICD-10-CM | POA: Diagnosis not present

## 2023-10-01 LAB — RAD ONC ARIA SESSION SUMMARY
Course Elapsed Days: 21
Plan Fractions Treated to Date: 1
Plan Prescribed Dose Per Fraction: 2 Gy
Plan Total Fractions Prescribed: 5
Plan Total Prescribed Dose: 10 Gy
Reference Point Dosage Given to Date: 2 Gy
Reference Point Session Dosage Given: 2 Gy
Session Number: 16

## 2023-10-02 ENCOUNTER — Other Ambulatory Visit: Payer: Self-pay

## 2023-10-02 ENCOUNTER — Ambulatory Visit
Admission: RE | Admit: 2023-10-02 | Discharge: 2023-10-02 | Disposition: A | Discharge status: Home / Self Care | Payer: Medicaid Other | Source: Ambulatory Visit | Attending: Radiation Oncology

## 2023-10-02 DIAGNOSIS — C50412 Malignant neoplasm of upper-outer quadrant of left female breast: Secondary | ICD-10-CM | POA: Diagnosis not present

## 2023-10-02 LAB — RAD ONC ARIA SESSION SUMMARY
Course Elapsed Days: 22
Plan Fractions Treated to Date: 2
Plan Prescribed Dose Per Fraction: 2 Gy
Plan Total Fractions Prescribed: 5
Plan Total Prescribed Dose: 10 Gy
Reference Point Dosage Given to Date: 4 Gy
Reference Point Session Dosage Given: 2 Gy
Session Number: 17

## 2023-10-03 ENCOUNTER — Other Ambulatory Visit: Payer: Self-pay

## 2023-10-03 ENCOUNTER — Ambulatory Visit
Admission: RE | Admit: 2023-10-03 | Discharge: 2023-10-03 | Disposition: A | Discharge status: Home / Self Care | Payer: Medicaid Other | Source: Ambulatory Visit | Attending: Radiation Oncology | Admitting: Radiation Oncology

## 2023-10-03 DIAGNOSIS — C50412 Malignant neoplasm of upper-outer quadrant of left female breast: Secondary | ICD-10-CM | POA: Diagnosis not present

## 2023-10-03 LAB — RAD ONC ARIA SESSION SUMMARY
Course Elapsed Days: 23
Plan Fractions Treated to Date: 3
Plan Prescribed Dose Per Fraction: 2 Gy
Plan Total Fractions Prescribed: 5
Plan Total Prescribed Dose: 10 Gy
Reference Point Dosage Given to Date: 6 Gy
Reference Point Session Dosage Given: 2 Gy
Session Number: 18

## 2023-10-06 ENCOUNTER — Ambulatory Visit
Admission: RE | Admit: 2023-10-06 | Discharge: 2023-10-06 | Disposition: A | Discharge status: Home / Self Care | Payer: Medicaid Other | Source: Ambulatory Visit | Attending: Radiation Oncology

## 2023-10-06 ENCOUNTER — Other Ambulatory Visit: Payer: Self-pay

## 2023-10-06 ENCOUNTER — Ambulatory Visit
Admission: RE | Admit: 2023-10-06 | Discharge: 2023-10-06 | Disposition: A | Discharge status: Home / Self Care | Source: Ambulatory Visit | Attending: Radiation Oncology | Admitting: Radiation Oncology

## 2023-10-06 DIAGNOSIS — C50412 Malignant neoplasm of upper-outer quadrant of left female breast: Secondary | ICD-10-CM

## 2023-10-06 LAB — RAD ONC ARIA SESSION SUMMARY
Course Elapsed Days: 26
Plan Fractions Treated to Date: 4
Plan Prescribed Dose Per Fraction: 2 Gy
Plan Total Fractions Prescribed: 5
Plan Total Prescribed Dose: 10 Gy
Reference Point Dosage Given to Date: 8 Gy
Reference Point Session Dosage Given: 2 Gy
Session Number: 19

## 2023-10-06 MED ORDER — RADIAPLEXRX EX GEL
Freq: Once | CUTANEOUS | Status: AC
  Administered 2023-10-06: 1 via TOPICAL

## 2023-10-07 ENCOUNTER — Other Ambulatory Visit: Payer: Self-pay

## 2023-10-07 ENCOUNTER — Ambulatory Visit
Admission: RE | Admit: 2023-10-07 | Discharge: 2023-10-07 | Disposition: A | Discharge status: Home / Self Care | Payer: Medicaid Other | Source: Ambulatory Visit | Attending: Radiation Oncology | Admitting: Radiation Oncology

## 2023-10-07 DIAGNOSIS — Z170 Estrogen receptor positive status [ER+]: Secondary | ICD-10-CM | POA: Diagnosis present

## 2023-10-07 DIAGNOSIS — C50412 Malignant neoplasm of upper-outer quadrant of left female breast: Secondary | ICD-10-CM | POA: Insufficient documentation

## 2023-10-07 LAB — RAD ONC ARIA SESSION SUMMARY
Course Elapsed Days: 27
Plan Fractions Treated to Date: 5
Plan Prescribed Dose Per Fraction: 2 Gy
Plan Total Fractions Prescribed: 5
Plan Total Prescribed Dose: 10 Gy
Reference Point Dosage Given to Date: 10 Gy
Reference Point Session Dosage Given: 2 Gy
Session Number: 20

## 2023-10-07 NOTE — Assessment & Plan Note (Deleted)
 pT1cN0M0, stage IA, ER+/PR+/HER2-, Oncotype RS 16, (+) DCIS -Discovered on screening mammogram -Status post lumpectomy and sentinel lymph node biopsy on August 07, 2023 -Oncotype DX RS 16, [place] is risk of distant recurrence of 4% in the next 9 years with AI, no benefit of adjuvant chemotherapy -Status post adjuvant radiation, completed 10/07/2023 -I recommend adjuvant Tamoxifen

## 2023-10-08 ENCOUNTER — Inpatient Hospital Stay: Payer: Medicaid Other | Admitting: Hematology

## 2023-10-08 DIAGNOSIS — Z170 Estrogen receptor positive status [ER+]: Secondary | ICD-10-CM

## 2023-10-08 NOTE — Radiation Completion Notes (Signed)
 Patient Name: Vanessa Carr, STRALEY MRN: [id number] Date of Birth: [birth year] Referring Physician: Malachy Mood, M.D. Date of Service: 2023-10-08 Radiation Oncologist: Lonie Peak, M.D. [facility] [place]                             RADIATION ONCOLOGY END OF TREATMENT NOTE     Diagnosis: C50.412 Malignant neoplasm of upper-outer quadrant of left female breast Staging on 2023-07-18: Malignant neoplasm of upper-outer quadrant of left breast in female, estrogen receptor positive (HCC) T=pT1c, N=pN0, M=cM0 Staging on 2023-07-04: Malignant neoplasm of upper-outer quadrant of left breast in female, estrogen receptor positive (HCC) T=cT1b, N=cN0, M=cM0 Intent: Curative     ==========DELIVERED PLANS==========  First Treatment Date: 2023-09-10 Last Treatment Date: 2023-10-07   Plan Name: Breast_L_BH Site: Breast, Left Technique: 3D Mode: Photon Dose Per Fraction: 2.67 Gy Prescribed Dose (Delivered / Prescribed): 40.05 Gy / 40.05 Gy Prescribed Fxs (Delivered / Prescribed): 15 / 15   Plan Name: Brst_L_BH_Bst Site: Breast, Left Technique: 3D Mode: Photon Dose Per Fraction: 2 Gy Prescribed Dose (Delivered / Prescribed): 10 Gy / 10 Gy Prescribed Fxs (Delivered / Prescribed): 5 / 5     ==========ON TREATMENT VISIT DATES========== 2023-09-10, 2023-09-22, 2023-09-29, 2023-10-06     ==========UPCOMING VISITS==========       ==========APPENDIX - ON TREATMENT VISIT NOTES==========   See weekly On Treatment Notes in Epic for details in the Media tab (listed as Progress notes on the On Treatment Visit Dates listed above).

## 2023-10-13 ENCOUNTER — Telehealth: Payer: Self-pay | Admitting: Nurse Practitioner

## 2023-10-13 NOTE — Telephone Encounter (Signed)
 Rescheduled appointment's per the patient having the flu. The patient is aware of the appointment details.

## 2023-10-14 ENCOUNTER — Inpatient Hospital Stay: Payer: Medicaid Other | Admitting: Genetic Counselor

## 2023-10-14 ENCOUNTER — Inpatient Hospital Stay: Admitting: Nurse Practitioner

## 2023-10-14 ENCOUNTER — Inpatient Hospital Stay: Payer: Medicaid Other

## 2023-10-20 ENCOUNTER — Ambulatory Visit: Payer: Medicaid Other

## 2023-11-03 ENCOUNTER — Ambulatory Visit: Payer: Medicaid Other | Attending: General Surgery

## 2023-11-03 DIAGNOSIS — Z9189 Other specified personal risk factors, not elsewhere classified: Secondary | ICD-10-CM | POA: Insufficient documentation

## 2023-11-03 DIAGNOSIS — Z170 Estrogen receptor positive status [ER+]: Secondary | ICD-10-CM | POA: Insufficient documentation

## 2023-11-03 DIAGNOSIS — Z483 Aftercare following surgery for neoplasm: Secondary | ICD-10-CM | POA: Insufficient documentation

## 2023-11-03 DIAGNOSIS — C50412 Malignant neoplasm of upper-outer quadrant of left female breast: Secondary | ICD-10-CM | POA: Insufficient documentation

## 2023-11-03 DIAGNOSIS — M79601 Pain in right arm: Secondary | ICD-10-CM | POA: Insufficient documentation

## 2023-11-03 NOTE — Progress Notes (Signed)
 Vanessa Carr is here today for a telephone follow up post radiation to the breast. Skin looks fine and is healing well. Patient says she has a knot under arm that is tender, she noticed it Monday morning. Knot is hard and tender. Patient's skin is getting lighter   Breast Side: Left Breast    They completed their radiation on: 10/07/2023   Does the patient complain of any of the following: Post radiation skin issues: None. Patient is using vitamin E oil and skin in treatment area has lightened and is returning back to normal. Breast Tenderness: None Breast Swelling: None Lymphadema: None Range of Motion limitations: None Fatigue post radiation: Patient's energy is coming back and fatigue is subsiding. Appetite good/fair/poor: Patient is on a high protein diet and eats salad, eggs, cheese and grilled chicken, yogurt and fruit in her diet. She says she has been trying to loose weight and has been successful in doing so.  Additional comments if applicable:  Patient will have occasional spells of nausea and vomiting at night since last summer, will notify the medical oncologist.  Pt reports Yes No Comments  Tamoxifen []  [x]    Letrozole []  [x]    Anastrazole []  [x]    Mammogram []  Date: December 2024 [] 

## 2023-11-06 ENCOUNTER — Ambulatory Visit
Admission: RE | Admit: 2023-11-06 | Discharge: 2023-11-06 | Disposition: A | Discharge status: Home / Self Care | Source: Ambulatory Visit | Attending: Radiation Oncology | Admitting: Radiation Oncology

## 2023-11-06 DIAGNOSIS — C50412 Malignant neoplasm of upper-outer quadrant of left female breast: Secondary | ICD-10-CM

## 2023-11-17 ENCOUNTER — Encounter: Payer: Self-pay | Admitting: Genetic Counselor

## 2023-11-17 NOTE — Progress Notes (Addendum)
 Patient Care Team: Benedetto Brady, MD as PCP - General (Family Medicine) Diamond Formica, [place] (Physician Assistant) Auther Bo, RN as Oncology Nurse Navigator Alane Hsu, RN as Oncology Nurse Navigator Sonja Whitmire, MD as Consulting Physician (Hematology)  Clinic Day:  11/18/2023  Referring physician: Benedetto Brady, MD  ASSESSMENT & PLAN:   Assessment & Plan: Malignant neoplasm of upper-outer quadrant of left breast in female, estrogen receptor positive (HCC) pT1cN0M0, stage IA, ER+/PR+/HER2-, g2 Invasive ductal carcinoma, stage 1A, with high-grade DCIS. Tumor size 1.5 cm, ER/PR strongly positive, HER2 negative. Initial surgery with lymph node removal showed four negative nodes. Positive margin for DCIS in the superior margin necessitating a second surgery.  -Oncotype testing performed with score of 16. A 4% risk of recurrence or metastatic disease in next 9 years with Tamoxifen  alone.  - Completed radiation therapy on 10/07/2023.  - Genetics appointment today.  - Start tamoxifen  20 mg daily  - labs and follow up 1 month.     Breast pain  Mild swelling and tenderness along the outer aspect of the left breast. Discussed with her symptoms and presentation of lymphedema. She has already seen PT to help with this. Understands the massage techniques to help reduce swelling. She sees surgeon next month as routine follow up after lumpectomy. She does have expected skin changes from radiation. Advised the patient to contact the clinic if breast pain, swelling, and/or warmth worsen prior to visit with surgery or follow up here. She may need course antibiotics.   Menorrhagia  The patient states that she continues to have heavy monthly periods with frequent clots. Her IUD was removed prior to breast cancer diagnosis. She was prescribed norethindrone 0.35 mg daily. Was asked to delay starting until after she completed treatment for breast cancer. As this is OCP which does not contain synthetic  estrogen, it is ok for her to begin this at any time.   Mild depression/anxiety Currently on Effexor  37.5 mg daily. Will increase this to 75 mg ER daily. Add alprazolam  0.5 mg. She may take 1/2 to 1 tablet daily as needed for moderate anxiety.   Plan:  Patient was seen with Dr. Maryalice Smaller today.  Labs ordered, including labs for genetic screening, as she does have strong family history of breast cancer.  Will treat with antibiotics if WBC elevated on blood count today.  Reviewed massage techniques for lymphedema of the breast.  -see surgery for regular follow up.  Start tamoxifen  20 mg daily.  Follow up with labs in 1 month    The patient understands the plans discussed today and is in agreement with them.  She knows to contact our office if she develops concerns prior to her next appointment.  I provided 30 minutes of face-to-face time during this encounter and > 50% was spent counseling as documented under my assessment and plan.    Sharyon Deis, NP  [facility] DEPT [facility] [address] [place] [address] Dept: [phone] Dept Fax: [phone]   Orders Placed This Encounter  Procedures   CBC with Differential (Cancer Center Only)    Standing Status:   Future    Number of Occurrences:   1    Expected Date:   11/18/2023    Expiration Date:   11/17/2024   CMP (Cancer Center only)    Standing Status:   Future    Number of Occurrences:  1    Expected Date:   11/18/2023    Expiration Date:   11/17/2024      CHIEF COMPLAINT:  CC: left breast cancer, estrogen receptor positive.   Current Treatment:  radiation followed by tamoxifen  treatment   INTERVAL HISTORY:  Murdis is here today for repeat clinical assessment. Initially seen by Dr. Maryalice Smaller 07/18/2023. She underwent initial lumpectomy. Had a second surgery due to positive margins of initial surgery. She underwent Oncotype testing. Her score was  16, indicating a 4% risk for recurrence/metastatic disease in next 9 years with tamoxifen  alone. Chemotherapy not recommended. Completed radiation treatment on 10/07/2023. States that she is having increased swelling and pain along the incision site. This has been going on for about 2 weeks. Initially, thought she was having breast pain due to expected onset of menstrual period. Pain and swelling continues now, even after menstrual cycle has stopped. She is also having increased anxiety. Currently taking Effexor  XR 373.5 mg daily. Initially, this was helping, but not doing much to help any longer. States that she can break down into tears at most anytime of day.  She is ready to begin anti-estrogen therapy with tamoxifen . She denies fevers or chills. She denies chest pain, chest pressure, or shortness of breath. She denies headaches or visual disturbances. She denies abdominal pain, nausea, vomiting, or changes in bowel or bladder habits.  She denies pain. Her appetite is good. Her weight has been stable.  I have reviewed the past medical history, past surgical history, social history and family history with the patient and they are unchanged from previous note.  ALLERGIES:  is allergic to sevoflurane.  MEDICATIONS:  Current Outpatient Medications  Medication Sig Dispense Refill   albuterol (VENTOLIN HFA) 108 (90 Base) MCG/ACT inhaler 2 puffs every 6 (six) hours as needed for wheezing.     ALPRAZolam  (XANAX ) 0.5 MG tablet Take 0.5-1 tablets (0.25-0.5 mg total) by mouth at bedtime as needed for anxiety. 30 tablet 0   Diclofenac Sodium 3 % GEL Apply 1 Application topically 2 (two) times daily.     FEROSUL 325 (65 Fe) MG tablet Take 325 mg by mouth every other day.     gabapentin  (NEURONTIN ) 100 MG capsule Take 1 capsule (100 mg total) by mouth 3 (three) times daily. 90 capsule 2   ibuprofen  (ADVIL ,MOTRIN ) 800 MG tablet Take 1 tablet (800 mg total) by mouth 3 (three) times daily. 21 tablet 0    lisinopril-hydrochlorothiazide (ZESTORETIC) 20-25 MG tablet Take 1 tablet by mouth daily.     meloxicam (MOBIC) 15 MG tablet Take 15 mg by mouth as needed.     oxyCODONE  (ROXICODONE ) 5 MG immediate release tablet Take 1 tablet (5 mg total) by mouth every 6 (six) hours as needed for severe pain (pain score 7-10). 15 tablet 0   oxyCODONE  (ROXICODONE ) 5 MG immediate release tablet Take 1 tablet (5 mg total) by mouth every 6 (six) hours as needed for severe pain (pain score 7-10). 10 tablet 0   polyethylene glycol powder (GLYCOLAX /MIRALAX ) 17 GM/SCOOP powder Take 17 g by mouth daily.     rOPINIRole (REQUIP) 3 MG tablet Take 3 mg by mouth at bedtime.     tamoxifen  (NOLVADEX ) 20 MG tablet Take 1 tablet (20 mg total) by mouth daily. 30 tablet 1   traMADol (ULTRAM) 50 MG tablet Take 50 mg by mouth as needed.     WEGOVY 0.25 MG/0.5ML SOAJ Inject 0.25 mg into the skin once a week.  venlafaxine  XR (EFFEXOR  XR) 75 MG 24 hr capsule Take 1 capsule (75 mg total) by mouth daily. 30 capsule 1   No current facility-administered medications for this visit.    HISTORY OF PRESENT ILLNESS:   Oncology History  Malignant neoplasm of upper-outer quadrant of left breast in female, estrogen receptor positive (HCC)  07/04/2023 Initial Diagnosis   Malignant neoplasm of upper-outer quadrant of left breast in female, estrogen receptor positive (HCC)   07/04/2023 Cancer Staging   Staging form: Breast, AJCC 8th Edition - Clinical stage from 07/04/2023: Stage IA (cT1b, cN0, cM0, G2, ER+, PR+, HER2-) - Signed by Colie Dawes, MD on 07/04/2023 Stage prefix: Initial diagnosis Histologic grading system: 3 grade system   07/18/2023 Cancer Staging   Staging form: Breast, AJCC 8th Edition - Pathologic: Stage IA (pT1c, pN0, cM0, G2, ER+, PR+, HER2-) - Signed by Sonja Trevorton, MD on 07/18/2023 Histologic grading system: 3 grade system Residual tumor (R): R0 - None       REVIEW OF SYSTEMS:   Constitutional: Denies fevers,  chills or abnormal weight loss Eyes: Denies blurriness of vision Ears, nose, mouth, throat, and face: Denies mucositis or sore throat Respiratory: Denies cough, dyspnea or wheezes Cardiovascular: Denies palpitation, chest discomfort or lower extremity swelling Gastrointestinal:  Denies nausea, heartburn or change in bowel habits Skin: Denies abnormal skin rashes Lymphatics: Denies new lymphadenopathy or easy bruising Neurological:Denies numbness, tingling or new weaknesses Behavioral/Psych: increased anxiety and depression. Crying more frequently.  Breast: swelling and pain of outer aspect of left breast.  All other systems were reviewed with the patient and are negative.   VITALS:   Today's Vitals   11/18/23 1036 11/18/23 1037  BP: 132/88   Pulse: 82   Resp: 17   Temp: 97.6 F (36.4 C)   SpO2: 98%   Weight: 236 lb 11.2 oz (107.4 kg)   PainSc:  0-No pain   Body mass index is 35.99 kg/m.   Wt Readings from Last 3 Encounters:  11/18/23 236 lb 11.2 oz (107.4 kg)  08/25/23 238 lb 4 oz (108.1 kg)  07/24/23 240 lb 8.4 oz (109.1 kg)    Body mass index is 35.99 kg/m.  Performance status (ECOG): 1 - Symptomatic but completely ambulatory  PHYSICAL EXAM:   GENERAL:alert, no distress and comfortable SKIN: skin color, texture, turgor are normal, no rashes or significant lesions EYES: normal, Conjunctiva are pink and non-injected, sclera clear OROPHARYNX:no exudate, no erythema and lips, buccal mucosa, and tongue normal  NECK: supple, thyroid normal size, non-tender, without nodularity LYMPH:  no palpable lymphadenopathy in the cervical, axillary or inguinal LUNGS: clear to auscultation and percussion with normal breathing effort HEART: regular rate & rhythm and no murmurs and no lower extremity edema ABDOMEN:abdomen soft, non-tender and normal bowel sounds Musculoskeletal:no cyanosis of digits and no clubbing  NEURO: alert & oriented x 3 with fluent speech, no focal  motor/sensory deficits BREAST: darkened skin color of the entire left breast. Some fluid/lymphedema noted in outer aspect of the left breast. Slight warmth to skin. No palpable masses or lumps in the left breast. No nipple inversion or nipple discharge. There is no axillary lymphadenopathy on the left. Surgical scars are well healed. The right breast is without palpable masses or lumps. There is no nipple inversion or nipple discharge. There is no axillary lymphadenopathy on the right .  LABORATORY DATA:  I have reviewed the data as listed    Component Value Date/Time   NA 138 11/18/2023 1204  K 3.6 11/18/2023 1204   CL 102 11/18/2023 1204   CO2 31 11/18/2023 1204   GLUCOSE 82 11/18/2023 1204   BUN 14 11/18/2023 1204   CREATININE 0.61 11/18/2023 1204   CALCIUM 9.0 11/18/2023 1204   PROT 7.0 11/18/2023 1204   ALBUMIN 4.0 11/18/2023 1204   AST 15 11/18/2023 1204   ALT 10 11/18/2023 1204   ALKPHOS 52 11/18/2023 1204   BILITOT 0.4 11/18/2023 1204   GFRNONAA >60 11/18/2023 1204   GFRAA >60 09/08/2015 1411    Lab Results  Component Value Date   WBC 7.1 11/18/2023   NEUTROABS 4.7 11/18/2023   HGB 12.9 11/18/2023   HCT 38.0 11/18/2023   MCV 82.3 11/18/2023   PLT 190 11/18/2023   Addendum I have seen the patient, examined her. I agree with the assessment and and plan and have edited the notes.   Pt is here for routine follow-up.  She completed adjuvant radiation last month.  She reports mild breast pain on the lateral of the left breast, I did not feel any palpable mass or adenopathy.  Her symptom is likely related to her surgery and radiation.  I recommend adjuvant tamoxifen , benefit and side effect discussed with her, she agrees.  Prescription called in today.  I spent a total of 25 minutes for her visit today.  Sonja Manvel MD 11/18/2023

## 2023-11-18 ENCOUNTER — Other Ambulatory Visit: Payer: Self-pay | Admitting: Nurse Practitioner

## 2023-11-18 ENCOUNTER — Inpatient Hospital Stay

## 2023-11-18 ENCOUNTER — Inpatient Hospital Stay (HOSPITAL_BASED_OUTPATIENT_CLINIC_OR_DEPARTMENT_OTHER): Admitting: Nurse Practitioner

## 2023-11-18 ENCOUNTER — Inpatient Hospital Stay: Attending: Genetic Counselor | Admitting: Genetic Counselor

## 2023-11-18 ENCOUNTER — Encounter: Payer: Self-pay | Admitting: Genetic Counselor

## 2023-11-18 ENCOUNTER — Other Ambulatory Visit: Payer: Self-pay | Admitting: Genetic Counselor

## 2023-11-18 VITALS — BP 132/88 | HR 82 | Temp 97.6°F | Resp 17 | Wt 236.7 lb

## 2023-11-18 DIAGNOSIS — Z923 Personal history of irradiation: Secondary | ICD-10-CM | POA: Diagnosis not present

## 2023-11-18 DIAGNOSIS — Z808 Family history of malignant neoplasm of other organs or systems: Secondary | ICD-10-CM | POA: Insufficient documentation

## 2023-11-18 DIAGNOSIS — Z170 Estrogen receptor positive status [ER+]: Secondary | ICD-10-CM

## 2023-11-18 DIAGNOSIS — N920 Excessive and frequent menstruation with regular cycle: Secondary | ICD-10-CM | POA: Diagnosis not present

## 2023-11-18 DIAGNOSIS — I890 Lymphedema, not elsewhere classified: Secondary | ICD-10-CM | POA: Insufficient documentation

## 2023-11-18 DIAGNOSIS — C50412 Malignant neoplasm of upper-outer quadrant of left female breast: Secondary | ICD-10-CM | POA: Insufficient documentation

## 2023-11-18 DIAGNOSIS — Z1721 Progesterone receptor positive status: Secondary | ICD-10-CM | POA: Diagnosis not present

## 2023-11-18 DIAGNOSIS — F32A Depression, unspecified: Secondary | ICD-10-CM | POA: Insufficient documentation

## 2023-11-18 DIAGNOSIS — N644 Mastodynia: Secondary | ICD-10-CM | POA: Insufficient documentation

## 2023-11-18 DIAGNOSIS — Z800 Family history of malignant neoplasm of digestive organs: Secondary | ICD-10-CM | POA: Insufficient documentation

## 2023-11-18 DIAGNOSIS — Z803 Family history of malignant neoplasm of breast: Secondary | ICD-10-CM | POA: Insufficient documentation

## 2023-11-18 LAB — CMP (CANCER CENTER ONLY)
ALT: 10 U/L (ref 0–44)
AST: 15 U/L (ref 15–41)
Albumin: 4 g/dL (ref 3.5–5.0)
Alkaline Phosphatase: 52 U/L (ref 38–126)
Anion gap: 5 (ref 5–15)
BUN: 14 mg/dL (ref 6–20)
CO2: 31 mmol/L (ref 22–32)
Calcium: 9 mg/dL (ref 8.9–10.3)
Chloride: 102 mmol/L (ref 98–111)
Creatinine: 0.61 mg/dL (ref 0.44–1.00)
GFR, Estimated: >60 mL/min (ref >60)
Glucose, Bld: 82 mg/dL (ref 70–99)
Potassium: 3.6 mmol/L (ref 3.5–5.1)
Sodium: 138 mmol/L (ref 135–145)
Total Bilirubin: 0.4 mg/dL (ref 0.0–1.2)
Total Protein: 7 g/dL (ref 6.5–8.1)

## 2023-11-18 LAB — CBC WITH DIFFERENTIAL (CANCER CENTER ONLY)
Abs Immature Granulocytes: 0.03 10*3/uL (ref 0.00–0.07)
Basophils Absolute: 0.1 10*3/uL (ref 0.0–0.1)
Basophils Relative: 1 %
Eosinophils Absolute: 0.1 10*3/uL (ref 0.0–0.5)
Eosinophils Relative: 1 %
HCT: 38 % (ref 36.0–46.0)
Hemoglobin: 12.9 g/dL (ref 12.0–15.0)
Immature Granulocytes: 0 %
Lymphocytes Relative: 25 %
Lymphs Abs: 1.8 10*3/uL (ref 0.7–4.0)
MCH: 27.9 pg (ref 26.0–34.0)
MCHC: 33.9 g/dL (ref 30.0–36.0)
MCV: 82.3 fL (ref 80.0–100.0)
Monocytes Absolute: 0.5 10*3/uL (ref 0.1–1.0)
Monocytes Relative: 7 %
Neutro Abs: 4.7 10*3/uL (ref 1.7–7.7)
Neutrophils Relative %: 66 %
Platelet Count: 190 10*3/uL (ref 150–400)
RBC: 4.62 MIL/uL (ref 3.87–5.11)
RDW: 13.2 % (ref 11.5–15.5)
WBC Count: 7.1 10*3/uL (ref 4.0–10.5)
nRBC: 0 % (ref 0.0–0.2)

## 2023-11-18 LAB — GENETIC SCREENING ORDER

## 2023-11-18 MED ORDER — GABAPENTIN 100 MG PO CAPS: 100.0000 mg | ORAL_CAPSULE | Freq: Three times a day (TID) | ORAL | 2 refills | Status: DC

## 2023-11-18 MED ORDER — TAMOXIFEN CITRATE 20 MG PO TABS: 20.0000 mg | ORAL_TABLET | Freq: Every day | ORAL | 1 refills | Status: DC

## 2023-11-18 MED ORDER — VENLAFAXINE HCL ER 75 MG PO CP24: 75.0000 mg | ORAL_CAPSULE | Freq: Every day | ORAL | 1 refills | Status: DC

## 2023-11-18 MED ORDER — ALPRAZOLAM 0.5 MG PO TABS: 0.2500 mg | ORAL_TABLET | Freq: Every evening | ORAL | 0 refills | Status: DC | PRN

## 2023-11-18 NOTE — Assessment & Plan Note (Addendum)
 pT1cN0M0, stage IA, ER+/PR+/HER2-, g2 Invasive ductal carcinoma, stage 1A, with high-grade DCIS. Tumor size 1.5 cm, ER/PR strongly positive, HER2 negative. Initial surgery with lymph node removal showed four negative nodes. Positive margin for DCIS in the superior margin necessitating a second surgery.  -Oncotype testing performed with score of 16. A 4% risk of recurrence or metastatic disease in next 9 years with Tamoxifen alone.  - Completed radiation therapy on 10/07/2023.  - Genetics appointment today.  - Start tamoxifen 20 mg daily  - labs and follow up 1 month.

## 2023-11-18 NOTE — Telephone Encounter (Signed)
 This was filled through her medication list. D/c lorazepam.

## 2023-11-18 NOTE — Progress Notes (Signed)
 REFERRING PROVIDER: Sonja Pioneer, MD [address] [place],  [place] [address]  PRIMARY PROVIDER:  Benedetto Brady, MD  PRIMARY REASON FOR VISIT:  1. Family history of breast cancer   2. Malignant neoplasm of upper-outer quadrant of left breast in female, estrogen receptor positive (HCC)   3. Family history of colon cancer   4. Family history of brain cancer   5. Family history of stomach cancer      HISTORY OF PRESENT ILLNESS:   Vanessa Carr, a 53 y.o. female, was seen for a [place] cancer genetics consultation at the request of Dr. Maryalice Smaller due to a personal and family history of cancer.  Vanessa Carr presents to clinic today to discuss the possibility of a hereditary predisposition to cancer, genetic testing, and to further clarify her future cancer risks, as well as potential cancer risks for family members.   In 2024, at the age of 83, Vanessa Carr was diagnosed with breast cancer of the left breast.     CANCER HISTORY:  Oncology History  Malignant neoplasm of upper-outer quadrant of left breast in female, estrogen receptor positive (HCC)  07/04/2023 Initial Diagnosis   Malignant neoplasm of upper-outer quadrant of left breast in female, estrogen receptor positive (HCC)   07/04/2023 Cancer Staging   Staging form: Breast, AJCC 8th Edition - Clinical stage from 07/04/2023: Stage IA (cT1b, cN0, cM0, G2, ER+, PR+, HER2-) - Signed by Colie Dawes, MD on 07/04/2023 Stage prefix: Initial diagnosis Histologic grading system: 3 grade system   07/18/2023 Cancer Staging   Staging form: Breast, AJCC 8th Edition - Pathologic: Stage IA (pT1c, pN0, cM0, G2, ER+, PR+, HER2-) - Signed by Sonja McHenry, MD on 07/18/2023 Histologic grading system: 3 grade system Residual tumor (R): R0 - None      RISK FACTORS:  Menarche was at age 26.  First live birth at age 69.  OCP use for approximately 10+ years.  Ovaries intact: yes.  Hysterectomy: no.  Menopausal status: perimenopausal.  HRT use: 0  years. Colonoscopy: no; not examined. Mammogram within the last year: yes. Number of breast biopsies: 1. Up to date with pelvic exams: yes. Any excessive radiation exposure in the past: no  Past Medical History:  Diagnosis Date   Anxiety    Arthritis    low back pain, legs   Asthma    At risk for malignant hyperthermia    [place]   Cancer [facility])    left breast IDC   Depression    Dyslipidemia    Family history of brain cancer    Family history of breast cancer    Family history of colon cancer    Family history of stomach cancer    Hypertension     Past Surgical History:  Procedure Laterality Date   BREAST BIOPSY Left 06/20/2023   US  LT BREAST BX W LOC DEV 1ST LESION IMG BX SPEC US  GUIDE 06/20/2023 [facility]   BREAST BIOPSY  07/10/2023   MM LT RADIOACTIVE SEED LOC MAMMO GUIDE 07/10/2023 [facility]   BREAST LUMPECTOMY WITH RADIOACTIVE SEED AND SENTINEL LYMPH NODE BIOPSY Left 07/11/2023   Procedure: LEFT BREAST RADIOACTIVE SEED LOCALIZED LUMPECTOMY AND SENTINEL NODE BIOPSY;  Surgeon: Caralyn Chandler, MD;  Location: [facility];  Service: General;  Laterality: Left;  PEC BLOCK   RE-EXCISION OF BREAST LUMPECTOMY Left 07/24/2023   Procedure: RE-EXCISION OF LEFT BREAST SUPERIOR MARGIN;  Surgeon: Caralyn Chandler, MD;  Location: [facility];  Service:  General;  Laterality: Left;   WISDOM TOOTH EXTRACTION      Social History   Socioeconomic History   Marital status: Divorced    Spouse name: Not on file   Number of children: 3   Years of education: Not on file   Highest education level: Not on file  Occupational History   Not on file  Tobacco Use   Smoking status: Never   Smokeless tobacco: Never  Substance and Sexual Activity   Alcohol use: Not Currently    Comment: socially   Drug use: No   Sexual activity: Yes    Comment: LD 07-06-23  Other Topics Concern   Not on file  Social History Narrative   ** Merged History  Encounter **       Social Drivers of Health   Financial Resource Strain: Not on file  Food Insecurity: No Food Insecurity (07/04/2023)   Hunger Vital Sign    Worried About Running Out of Food in the Last Year: Never true    Ran Out of Food in the Last Year: Never true  Transportation Needs: No Transportation Needs (07/04/2023)   PRAPARE - Administrator, Civil Service (Medical): No    Lack of Transportation (Non-Medical): No  Physical Activity: Not on file  Stress: Not on file  Social Connections: Unknown (03/26/2023)   Received from [facility]   Social Network    Social Network: Not on file     FAMILY HISTORY:  We obtained a detailed, 4-generation family history.  Significant diagnoses are listed below: Family History  Problem Relation Age of Onset   Hypertension Mother    Breast cancer Mother 25   Gastric cancer Father    Asthma Father    Leukemia Father    Cervical cancer Sister    Breast cancer Maternal Aunt 80   Breast cancer Paternal Aunt        dx. 30s   Dementia Maternal Grandmother    Lung disease Maternal Grandfather        black lung   Dementia Paternal Grandmother    Colon cancer Paternal Grandfather        d. 57s   Breast cancer Cousin 50       pat first cousin   Brain cancer Cousin        pat first cousin     The patient has two sons and a daughter who are cancer free.  She has two sisters and a brother.  One sister had cervical cancer.  Both parents are living.  The patient's mother had breast cancer at 84.  She had three sisters and two brothers, one sister had breast cancer.  No other cancer is reported.  The patient's father had gastric cancer and leukemia.  He has three sisters and three brothers.  One sister had breast cancer in her 44's and has a daughter who had breast cancer at 12.  One brother had a son with brain cancer.  The paternal grandfather had colon cancer.  Vanessa Carr is unaware of previous family history of genetic  testing for hereditary cancer risks.   GENETIC COUNSELING ASSESSMENT: Vanessa Carr is a 53 y.o. female with a personal and family history of cancer which is somewhat suggestive of a hereditary cancer syndrome and predisposition to cancer given the number of women with breast cancer on both sides of the family, along with early ages of onset. We, therefore, discussed and recommended the following at today's visit.  DISCUSSION: We discussed that, in general, most cancer is not inherited in families, but instead is sporadic or familial. Sporadic cancers occur by chance and typically happen at older ages (>50 years) as this type of cancer is caused by genetic changes acquired during an individual's lifetime. Some families have more cancers than would be expected by chance; however, the ages or types of cancer are not consistent with a known genetic mutation or known genetic mutations have been ruled out. This type of familial cancer is thought to be due to a combination of multiple genetic, environmental, hormonal, and lifestyle factors. While this combination of factors likely increases the risk of cancer, the exact source of this risk is not currently identifiable or testable.  We discussed that 5 - 10% of breast cancer is hereditary, with most cases associated with BRCA mutations.  There are other genes that can be associated with hereditary breast cancer syndromes.  These include ATM, CHEK2 and PALB2.  We discussed that testing is beneficial for several reasons including knowing how to follow individuals after completing their treatment, identifying whether potential treatment options such as PARP inhibitors would be beneficial, and understand if other family members could be at risk for cancer and allow them to undergo genetic testing.   We reviewed the characteristics, features and inheritance patterns of hereditary cancer syndromes. We also discussed genetic testing, including the appropriate family  members to test, the process of testing, insurance coverage and turn-around-time for results. We discussed the implications of a negative, positive, carrier and/or variant of uncertain significant result. Vanessa Carr  was offered a common hereditary cancer panel (36+ genes) and an expanded pan-cancer panel (70+ genes). Vanessa Carr was informed of the benefits and limitations of each panel, including that expanded pan-cancer panels contain genes that do not have clear management guidelines at this point in time.  We also discussed that as the number of genes included on a panel increases, the chances of variants of uncertain significance increases. Vanessa Carr decided to pursue genetic testing for the CancerNext-Expanded+RNAinsight gene panel.   The CancerNext-Expanded gene panel offered by [facility] and includes sequencing, rearrangement, and RNA analysis for the following 77 genes: AIP, ALK, APC, ATM, BAP1, BARD1, BMPR1A, BRCA1, BRCA2, BRIP1, CDC73, CDH1, CDK4, CDKN1B, CDKN2A, CEBPA, CHEK2, CTNNA1, DDX41, DICER1, ETV6, FH, FLCN, GATA2, LZTR1, MAX, MBD4, MEN1, MET, MLH1, MSH2, MSH3, MSH6, MUTYH, NF1, NF2, NTHL1, PALB2, PHOX2B, PMS2, POT1, PRKAR1A, PTCH1, PTEN, RAD51C, RAD51D, RB1, RET, RPS20, RUNX1, SDHA, SDHAF2, SDHB, SDHC, SDHD, SMAD4, SMARCA4, SMARCB1, SMARCE1, STK11, SUFU, TMEM127, TP53, TSC1, TSC2, VHL, and WT1 (sequencing and deletion/duplication); AXIN2, CTNNA1, DDX41, EGFR, HOXB13, KIT, MBD4, MITF, MSH3, PDGFRA, POLD1 and POLE (sequencing only); EPCAM and GREM1 (deletion/duplication only). RNA data is routinely analyzed for use in variant interpretation for all genes.   Based on Vanessa Carr's personal and family history of cancer, she meets medical criteria for genetic testing. Despite that she meets criteria, she may still have an out of pocket cost. We discussed that if her out of pocket cost for testing is over $100, the laboratory will call and confirm whether she wants to proceed with testing.   If the out of pocket cost of testing is less than $100 she will be billed by the genetic testing laboratory.   We discussed that some people do not want to undergo genetic testing due to fear of genetic discrimination.  The Genetic Information Nondiscrimination Act (GINA) was signed into federal law in 2008. GINA prohibits health insurers  and most employers from discriminating against individuals based on genetic information (including the results of genetic tests and family history information). According to GINA, health insurance companies cannot consider genetic information to be a preexisting condition, nor can they use it to make decisions regarding coverage or rates. GINA also makes it illegal for most employers to use genetic information in making decisions about hiring, firing, promotion, or terms of employment. It is important to note that GINA does not offer protections for life insurance, disability insurance, or long-term care insurance. GINA does not apply to those in the [employer], those who work for companies with less than 15 employees, and new life insurance or long-term disability insurance policies.  Health status due to a cancer diagnosis is not protected under GINA. More information about GINA can be found by visiting [email or web address].  PLAN: After considering the risks, benefits, and limitations, Ms. Sumida provided informed consent to pursue genetic testing and the blood sample was sent to [facility] for analysis of the CancerNext-Expanded+RNAinsight. Results should be available within approximately 2-3 weeks' time, at which point they will be disclosed by telephone to Ms. Somers, as will any additional recommendations warranted by these results. Ms. Reddic will receive a summary of her genetic counseling visit and a copy of her results once available. This information will also be available in Epic.   Lastly, we encouraged Ms. Ginzburg to remain in contact with cancer  genetics annually so that we can continuously update the family history and inform her of any changes in cancer genetics and testing that may be of benefit for this family.   Ms. Rementer questions were answered to her satisfaction today. Our contact information was provided should additional questions or concerns arise. Thank you for the referral and allowing us  to share in the care of your patient.   Jaclyn Carew P. [place], MS, CGC Licensed, Patent attorney [email or web address] phone: [phone]  60 minutes were spent on the date of the encounter in service to the patient including preparation, face-to-face consultation, documentation and care coordination.  The patient brought her sister. Drs. Johnna Nakai, and/or Gud[place] were available for questions, if needed..    _______________________________________________________________________ For Office Staff:  Number of people involved in session: 2 Was an Intern/ student involved with case: no

## 2023-11-19 ENCOUNTER — Telehealth: Payer: Self-pay | Admitting: Nurse Practitioner

## 2023-11-19 NOTE — Telephone Encounter (Signed)
 Vanessa Carr scheduled her follow up appointment.

## 2023-11-19 NOTE — Progress Notes (Signed)
 Submitted prior authorization request for Gabapentin  100 mg Capsules for Patient per Pharmacy request. Received response from CoverMyMeds as follows:  We received a prior authorization request for the member and product listed above. The Community and [facility] Prior Authorization Team is not able to review this request because requested medication does not require prior authorization. Please visit [email or web address] to view our most up-to-date Preferred Drug List (PDL), prior authorization forms, and additional pharmacy resources. (Select Menu> Health Plans by State> Select your state> Under Medicaid (Community Plan): Select View Offered Plan Information> Pharmacy Resources & Physician Administered Drugs). In some instances, a pharmacy Clarification Code override may be required to process the claim. The pharmacy may obtain assistance by contacting the [facility] Help Desk at [phone]. [place]  pharmacists, please call [phone]. [place]  pharmacists, please call [phone].  Notified Patient. No other needs or concerns noted at this time.

## 2023-11-20 ENCOUNTER — Other Ambulatory Visit: Payer: Self-pay

## 2023-11-20 ENCOUNTER — Telehealth: Payer: Self-pay

## 2023-11-20 NOTE — Telephone Encounter (Signed)
 Received telephone call from the patient inquiring her Xanax  Rx.  Patient stated the pharmacy had not received the Rx. Reached out to the [facility]. Was advised that the Rx had been received and they will fill the Rx and contact the patient when it is ready to be picked up.Let patient know the details of the Rx. Patient voiced understanding.

## 2023-12-04 ENCOUNTER — Telehealth: Payer: Self-pay | Admitting: Genetic Counselor

## 2023-12-04 ENCOUNTER — Encounter: Payer: Self-pay | Admitting: Genetic Counselor

## 2023-12-04 DIAGNOSIS — Z1379 Encounter for other screening for genetic and chromosomal anomalies: Secondary | ICD-10-CM | POA: Insufficient documentation

## 2023-12-04 NOTE — Telephone Encounter (Signed)
Revealed negative genetic testing.  Discussed that we do not know why she has breast cancer or why there is cancer in the family. It could be due to a different gene that we are not testing, or maybe our current technology may not be able to pick something up.  It will be important for her to keep in contact with genetics to keep up with whether additional testing may be needed. 

## 2023-12-05 ENCOUNTER — Ambulatory Visit: Payer: Self-pay | Admitting: Genetic Counselor

## 2023-12-05 DIAGNOSIS — Z1379 Encounter for other screening for genetic and chromosomal anomalies: Secondary | ICD-10-CM

## 2023-12-05 DIAGNOSIS — C50412 Malignant neoplasm of upper-outer quadrant of left female breast: Secondary | ICD-10-CM

## 2023-12-05 NOTE — Progress Notes (Signed)
 HPI:  Ms. Bodie was previously seen in the [facility] due to a personal and family history of cancer and concerns regarding a hereditary predisposition to cancer. Please refer to our prior cancer genetics clinic note for more information regarding our discussion, assessment and recommendations, at the time. Ms. Silveria recent genetic test results were disclosed to her, as were recommendations warranted by these results. These results and recommendations are discussed in more detail below.  CANCER HISTORY:  Oncology History  Malignant neoplasm of upper-outer quadrant of left breast in female, estrogen receptor positive (HCC)  07/04/2023 Initial Diagnosis   Malignant neoplasm of upper-outer quadrant of left breast in female, estrogen receptor positive (HCC)   07/04/2023 Cancer Staging   Staging form: Breast, AJCC 8th Edition - Clinical stage from 07/04/2023: Stage IA (cT1b, cN0, cM0, G2, ER+, PR+, HER2-) - Signed by Colie Dawes, MD on 07/04/2023 Stage prefix: Initial diagnosis Histologic grading system: 3 grade system   07/18/2023 Cancer Staging   Staging form: Breast, AJCC 8th Edition - Pathologic: Stage IA (pT1c, pN0, cM0, G2, ER+, PR+, HER2-) - Signed by Sonja [place], MD on 07/18/2023 Histologic grading system: 3 grade system Residual tumor (R): R0 - None   12/04/2023 Genetic Testing   Negative genetic testing on the CancerNext-Expanded+RNAinsight panel.  The report date is Dec 03, 2023.  The CancerNext-Expanded gene panel offered by [facility] and includes sequencing, rearrangement, and RNA analysis for the following 77 genes: AIP, ALK, APC, ATM, BAP1, BARD1, BMPR1A, BRCA1, BRCA2, BRIP1, CDC73, CDH1, CDK4, CDKN1B, CDKN2A, CEBPA, CHEK2, CTNNA1, DDX41, DICER1, ETV6, FH, FLCN, GATA2, LZTR1, MAX, MBD4, MEN1, MET, MLH1, MSH2, MSH3, MSH6, MUTYH, NF1, NF2, NTHL1, PALB2, PHOX2B, PMS2, POT1, PRKAR1A, PTCH1, PTEN, RAD51C, RAD51D, RB1, RET, RPS20, RUNX1, SDHA, SDHAF2, SDHB,  SDHC, SDHD, SMAD4, SMARCA4, SMARCB1, SMARCE1, STK11, SUFU, TMEM127, TP53, TSC1, TSC2, VHL, and WT1 (sequencing and deletion/duplication); AXIN2, CTNNA1, DDX41, EGFR, HOXB13, KIT, MBD4, MITF, MSH3, PDGFRA, POLD1 and POLE (sequencing only); EPCAM and GREM1 (deletion/duplication only). RNA data is routinely analyzed for use in variant interpretation for all genes.      FAMILY HISTORY:  We obtained a detailed, 4-generation family history.  Significant diagnoses are listed below: Family History  Problem Relation Age of Onset   Hypertension Mother    Breast cancer Mother 97   Gastric cancer Father    Asthma Father    Leukemia Father    Cervical cancer Sister    Breast cancer Maternal Aunt 47   Breast cancer Paternal Aunt        dx. 30s   Dementia Maternal Grandmother    Lung disease Maternal Grandfather        black lung   Dementia Paternal Grandmother    Colon cancer Paternal Grandfather        d. 32s   Breast cancer Cousin 50       pat first cousin   Brain cancer Cousin        pat first cousin       The patient has two sons and a daughter who are cancer free.  She has two sisters and a brother.  One sister had cervical cancer.  Both parents are living.   The patient's mother had breast cancer at 31.  She had three sisters and two brothers, one sister had breast cancer.  No other cancer is reported.   The patient's father had gastric cancer and leukemia.  He has three sisters and three brothers.  One sister  had breast cancer in her 13's and has a daughter who had breast cancer at 32.  One brother had a son with brain cancer.  The paternal grandfather had colon cancer.   Ms. Suttles is unaware of previous family history of genetic testing for hereditary cancer risks.   GENETIC TEST RESULTS: Genetic testing reported out on Dec 03, 2023 through the CancerNext-Expanded+RNAinsight cancer panel found no pathogenic mutations. The CancerNext-Expanded gene panel offered by [facility] and  includes sequencing, rearrangement, and RNA analysis for the following 77 genes: AIP, ALK, APC, ATM, BAP1, BARD1, BMPR1A, BRCA1, BRCA2, BRIP1, CDC73, CDH1, CDK4, CDKN1B, CDKN2A, CEBPA, CHEK2, CTNNA1, DDX41, DICER1, ETV6, FH, FLCN, GATA2, LZTR1, MAX, MBD4, MEN1, MET, MLH1, MSH2, MSH3, MSH6, MUTYH, NF1, NF2, NTHL1, PALB2, PHOX2B, PMS2, POT1, PRKAR1A, PTCH1, PTEN, RAD51C, RAD51D, RB1, RET, RPS20, RUNX1, SDHA, SDHAF2, SDHB, SDHC, SDHD, SMAD4, SMARCA4, SMARCB1, SMARCE1, STK11, SUFU, TMEM127, TP53, TSC1, TSC2, VHL, and WT1 (sequencing and deletion/duplication); AXIN2, CTNNA1, DDX41, EGFR, HOXB13, KIT, MBD4, MITF, MSH3, PDGFRA, POLD1 and POLE (sequencing only); EPCAM and GREM1 (deletion/duplication only). RNA data is routinely analyzed for use in variant interpretation for all genes. The test report has been scanned into EPIC and is located under the Molecular Pathology section of the Results Review tab.  A portion of the result report is included below for reference.     We discussed with Ms. Kaylor that because current genetic testing is not perfect, it is possible there may be a gene mutation in one of these genes that current testing cannot detect, but that chance is small.  We also discussed, that there could be another gene that has not yet been discovered, or that we have not yet tested, that is responsible for the cancer diagnoses in the family. It is also possible there is a hereditary cause for the cancer in the family that Ms. Duch did not inherit and therefore was not identified in her testing.  Therefore, it is important to remain in touch with cancer genetics in the future so that we can continue to offer Ms. Edmister the most up to date genetic testing.   ADDITIONAL GENETIC TESTING: We discussed with Ms. Spaziani that her genetic testing was fairly extensive.  If there are genes identified to increase cancer risk that can be analyzed in the future, we would be happy to discuss and coordinate this  testing at that time.    CANCER SCREENING RECOMMENDATIONS: Ms. Wisham test result is considered negative (normal).  This means that we have not identified a hereditary cause for her personal and family history of cancer at this time. Most cancers happen by chance and this negative test suggests that her personal and family history of cancer may fall into this category.    Possible reasons for Ms. Gladney's negative genetic test include:  1. There may be a gene mutation in one of these genes that current testing methods cannot detect but that chance is small.  2. There could be another gene that has not yet been discovered, or that we have not yet tested, that is responsible for the cancer diagnoses in the family.  3.  There may be no hereditary risk for cancer in the family. The cancers in Ms. Douty and/or her family may be sporadic/familial or due to other genetic and environmental factors. 4. It is also possible there is a hereditary cause for the cancer in the family that Ms. Yurchak did not inherit.  Therefore, it is recommended she continue to follow  the cancer management and screening guidelines provided by her oncology and primary healthcare provider. An individual's cancer risk and medical management are not determined by genetic test results alone. Overall cancer risk assessment incorporates additional factors, including personal medical history, family history, and any available genetic information that may result in a personalized plan for cancer prevention and surveillance  RECOMMENDATIONS FOR FAMILY MEMBERS:   Since she did not inherit a identifiable mutation in a cancer predisposition gene included on this panel, her children could not have inherited a known mutation from her in one of these genes. Individuals in this family might be at some increased risk of developing cancer, over the general population risk, simply due to the family history of cancer.  We recommended women in this  family have a yearly mammogram beginning at age 64, or 66 years younger than the earliest onset of cancer, an annual clinical breast exam, and perform monthly breast self-exams. Women in this family should also have a gynecological exam as recommended by their primary provider. All family members should be referred for colonoscopy starting at age 28, or 63 years younger than the earliest onset of cancer. It is also possible there is a hereditary cause for the cancer in Ms. Eaglin's family that she did not inherit and therefore was not identified in her.  Based on Ms. Droke's family history, we recommended her paternal aunt, or her aunt's daughter, who was diagnosed with breast cancer in her 7's, have genetic counseling and testing. Ms. Atha will let us  know if we can be of any assistance in coordinating genetic counseling and/or testing for this family member.   FOLLOW-UP: Lastly, we discussed with Ms. Daughenbaugh that cancer genetics is a rapidly advancing field and it is possible that new genetic tests will be appropriate for her and/or her family members in the future. We encouraged her to remain in contact with cancer genetics on an annual basis so we can update her personal and family histories and let her know of advances in cancer genetics that may benefit this family.   Our contact number was provided. Ms. Mccrory questions were answered to her satisfaction, and she knows she is welcome to call us  at anytime with additional questions or concerns.   Marijo Shove, MS, [facility] Licensed, Certified Genetic Counselor [email or web address]

## 2023-12-18 ENCOUNTER — Other Ambulatory Visit: Payer: Self-pay

## 2023-12-18 DIAGNOSIS — Z170 Estrogen receptor positive status [ER+]: Secondary | ICD-10-CM

## 2023-12-18 NOTE — Progress Notes (Deleted)
 Patient Care Team: Benedetto Brady, MD as PCP - General (Family Medicine) Diamond Formica, [place] (Physician Assistant) Auther Bo, RN as Oncology Nurse Navigator Alane Hsu, RN as Oncology Nurse Navigator Sonja Henderson, MD as Consulting Physician (Hematology)  Clinic Day:  12/18/2023  Referring physician: Benedetto Brady, MD  ASSESSMENT & PLAN:   Assessment & Plan: No problem-specific Assessment & Plan notes found for this encounter.    The patient understands the plans discussed today and is in agreement with them.  She knows to contact our office if she develops concerns prior to her next appointment.  I provided *** minutes of face-to-face time during this encounter and > 50% was spent counseling as documented under my assessment and plan.    Sharyon Deis, NP  [facility] - A DEPT OF [facility] [address] [place] [address] Dept: [phone] Dept Fax: [phone]   No orders of the defined types were placed in this encounter.     CHIEF COMPLAINT:  CC: left breast cancer, estrogen receptor positive   Current Treatment:  tamoxifen  20 mg daily   INTERVAL HISTORY:  Vanessa is here today for repeat clinical assessment. She was last seen by myself on 11/18/2023. She was started on tamoxifen  at that visit. Genetics counseling done on that day. She has no pathogenic mutations, no variants of unknown significance, and no gross deletions/duplications. Overall, her genetics testing is negative for clinically significant variants. She denies fevers or chills. She denies pain. Her appetite is good. Her weight {Weight change:10426}.  I have reviewed the past medical history, past surgical history, social history and family history with the patient and they are unchanged from previous note.  ALLERGIES:  is allergic to sevoflurane.  MEDICATIONS:  Current Outpatient Medications  Medication Sig Dispense Refill    albuterol (VENTOLIN HFA) 108 (90 Base) MCG/ACT inhaler 2 puffs every 6 (six) hours as needed for wheezing.     ALPRAZolam  (XANAX ) 0.5 MG tablet Take 0.5-1 tablets (0.25-0.5 mg total) by mouth at bedtime as needed for anxiety. 30 tablet 0   Diclofenac Sodium 3 % GEL Apply 1 Application topically 2 (two) times daily.     FEROSUL 325 (65 Fe) MG tablet Take 325 mg by mouth every other day.     gabapentin  (NEURONTIN ) 100 MG capsule Take 1 capsule (100 mg total) by mouth 3 (three) times daily. 90 capsule 2   ibuprofen  (ADVIL ,MOTRIN ) 800 MG tablet Take 1 tablet (800 mg total) by mouth 3 (three) times daily. 21 tablet 0   lisinopril-hydrochlorothiazide (ZESTORETIC) 20-25 MG tablet Take 1 tablet by mouth daily.     meloxicam (MOBIC) 15 MG tablet Take 15 mg by mouth as needed.     oxyCODONE  (ROXICODONE ) 5 MG immediate release tablet Take 1 tablet (5 mg total) by mouth every 6 (six) hours as needed for severe pain (pain score 7-10). 15 tablet 0   oxyCODONE  (ROXICODONE ) 5 MG immediate release tablet Take 1 tablet (5 mg total) by mouth every 6 (six) hours as needed for severe pain (pain score 7-10). 10 tablet 0   polyethylene glycol powder (GLYCOLAX /MIRALAX ) 17 GM/SCOOP powder Take 17 g by mouth daily.     rOPINIRole (REQUIP) 3 MG tablet Take 3 mg by mouth at bedtime.     tamoxifen  (NOLVADEX ) 20 MG tablet Take 1 tablet (20 mg total) by mouth daily. 30 tablet 1   traMADol (ULTRAM) 50 MG tablet  Take 50 mg by mouth as needed.     venlafaxine  XR (EFFEXOR  XR) 75 MG 24 hr capsule Take 1 capsule (75 mg total) by mouth daily. 30 capsule 1   WEGOVY 0.25 MG/0.5ML SOAJ Inject 0.25 mg into the skin once a week.     No current facility-administered medications for this visit.    HISTORY OF PRESENT ILLNESS:   Oncology History  Malignant neoplasm of upper-outer quadrant of left breast in female, estrogen receptor positive (HCC)  07/04/2023 Initial Diagnosis   Malignant neoplasm of upper-outer quadrant of left breast  in female, estrogen receptor positive (HCC)   07/04/2023 Cancer Staging   Staging form: Breast, AJCC 8th Edition - Clinical stage from 07/04/2023: Stage IA (cT1b, cN0, cM0, G2, ER+, PR+, HER2-) - Signed by Colie Dawes, MD on 07/04/2023 Stage prefix: Initial diagnosis Histologic grading system: 3 grade system   07/18/2023 Cancer Staging   Staging form: Breast, AJCC 8th Edition - Pathologic: Stage IA (pT1c, pN0, cM0, G2, ER+, PR+, HER2-) - Signed by Sonja Chilton, MD on 07/18/2023 Histologic grading system: 3 grade system Residual tumor (R): R0 - None   12/04/2023 Genetic Testing   Negative genetic testing on the CancerNext-Expanded+RNAinsight panel.  The report date is Dec 03, 2023.  The CancerNext-Expanded gene panel offered by [facility] and includes sequencing, rearrangement, and RNA analysis for the following 77 genes: AIP, ALK, APC, ATM, BAP1, BARD1, BMPR1A, BRCA1, BRCA2, BRIP1, CDC73, CDH1, CDK4, CDKN1B, CDKN2A, CEBPA, CHEK2, CTNNA1, DDX41, DICER1, ETV6, FH, FLCN, GATA2, LZTR1, MAX, MBD4, MEN1, MET, MLH1, MSH2, MSH3, MSH6, MUTYH, NF1, NF2, NTHL1, PALB2, PHOX2B, PMS2, POT1, PRKAR1A, PTCH1, PTEN, RAD51C, RAD51D, RB1, RET, RPS20, RUNX1, SDHA, SDHAF2, SDHB, SDHC, SDHD, SMAD4, SMARCA4, SMARCB1, SMARCE1, STK11, SUFU, TMEM127, TP53, TSC1, TSC2, VHL, and WT1 (sequencing and deletion/duplication); AXIN2, CTNNA1, DDX41, EGFR, HOXB13, KIT, MBD4, MITF, MSH3, PDGFRA, POLD1 and POLE (sequencing only); EPCAM and GREM1 (deletion/duplication only). RNA data is routinely analyzed for use in variant interpretation for all genes.        REVIEW OF SYSTEMS:   Constitutional: Denies fevers, chills or abnormal weight loss Eyes: Denies blurriness of vision Ears, nose, mouth, throat, and face: Denies mucositis or sore throat Respiratory: Denies cough, dyspnea or wheezes Cardiovascular: Denies palpitation, chest discomfort or lower extremity swelling Gastrointestinal:  Denies nausea, heartburn or change in  bowel habits Skin: Denies abnormal skin rashes Lymphatics: Denies new lymphadenopathy or easy bruising Neurological:Denies numbness, tingling or new weaknesses Behavioral/Psych: Mood is stable, no new changes  All other systems were reviewed with the patient and are negative.   VITALS:  There were no vitals taken for this visit.  Wt Readings from Last 3 Encounters:  11/18/23 236 lb 11.2 oz (107.4 kg)  08/25/23 238 lb 4 oz (108.1 kg)  07/24/23 240 lb 8.4 oz (109.1 kg)    There is no height or weight on file to calculate BMI.  Performance status (ECOG): {CHL ONC [id number]  PHYSICAL EXAM:   GENERAL:alert, no distress and comfortable SKIN: skin color, texture, turgor are normal, no rashes or significant lesions EYES: normal, Conjunctiva are pink and non-injected, sclera clear OROPHARYNX:no exudate, no erythema and lips, buccal mucosa, and tongue normal  NECK: supple, thyroid normal size, non-tender, without nodularity LYMPH:  no palpable lymphadenopathy in the cervical, axillary or inguinal LUNGS: clear to auscultation and percussion with normal breathing effort HEART: regular rate & rhythm and no murmurs and no lower extremity edema ABDOMEN:abdomen soft, non-tender and normal bowel sounds Musculoskeletal:no cyanosis of digits  and no clubbing  NEURO: alert & oriented x 3 with fluent speech, no focal motor/sensory deficits  LABORATORY DATA:  I have reviewed the data as listed    Component Value Date/Time   NA 138 11/18/2023 1204   K 3.6 11/18/2023 1204   CL 102 11/18/2023 1204   CO2 31 11/18/2023 1204   GLUCOSE 82 11/18/2023 1204   BUN 14 11/18/2023 1204   CREATININE 0.61 11/18/2023 1204   CALCIUM 9.0 11/18/2023 1204   PROT 7.0 11/18/2023 1204   ALBUMIN 4.0 11/18/2023 1204   AST 15 11/18/2023 1204   ALT 10 11/18/2023 1204   ALKPHOS 52 11/18/2023 1204   BILITOT 0.4 11/18/2023 1204   GFRNONAA >60 11/18/2023 1204   GFRAA >60 09/08/2015 1411    No results found for:  SPEP, UPEP  Lab Results  Component Value Date   WBC 7.1 11/18/2023   NEUTROABS 4.7 11/18/2023   HGB 12.9 11/18/2023   HCT 38.0 11/18/2023   MCV 82.3 11/18/2023   PLT 190 11/18/2023      Chemistry      Component Value Date/Time   NA 138 11/18/2023 1204   K 3.6 11/18/2023 1204   CL 102 11/18/2023 1204   CO2 31 11/18/2023 1204   BUN 14 11/18/2023 1204   CREATININE 0.61 11/18/2023 1204      Component Value Date/Time   CALCIUM 9.0 11/18/2023 1204   ALKPHOS 52 11/18/2023 1204   AST 15 11/18/2023 1204   ALT 10 11/18/2023 1204   BILITOT 0.4 11/18/2023 1204       RADIOGRAPHIC STUDIES: I have personally reviewed the radiological images as listed and agreed with the findings in the report. No results found.

## 2023-12-19 ENCOUNTER — Telehealth: Payer: Self-pay

## 2023-12-19 ENCOUNTER — Inpatient Hospital Stay: Attending: Genetic Counselor

## 2023-12-19 ENCOUNTER — Inpatient Hospital Stay: Admitting: Nurse Practitioner

## 2023-12-19 NOTE — Telephone Encounter (Signed)
 Patient did not arrive to her appointments today. Attempted to contact the patient to inquire whereabouts.  Unable to reach the patient @T #[phone] LVM to contact the facility @T #[phone].

## 2024-01-05 ENCOUNTER — Other Ambulatory Visit: Payer: Self-pay | Admitting: Nurse Practitioner

## 2024-01-05 DIAGNOSIS — C50412 Malignant neoplasm of upper-outer quadrant of left female breast: Secondary | ICD-10-CM

## 2024-01-20 ENCOUNTER — Other Ambulatory Visit: Payer: Self-pay | Admitting: Nurse Practitioner

## 2024-01-20 ENCOUNTER — Telehealth: Payer: Self-pay | Admitting: Nurse Practitioner

## 2024-01-20 DIAGNOSIS — Z170 Estrogen receptor positive status [ER+]: Secondary | ICD-10-CM

## 2024-01-20 MED ORDER — TAMOXIFEN CITRATE 20 MG PO TABS: 20.0000 mg | ORAL_TABLET | Freq: Every day | ORAL | 1 refills | Status: DC

## 2024-01-20 NOTE — Telephone Encounter (Signed)
 Rescheduled appointment per provider PAL. Talked with the patient and she is aware of the changes made to her upcoming appointment.

## 2024-01-27 ENCOUNTER — Inpatient Hospital Stay: Admitting: Nurse Practitioner

## 2024-02-23 ENCOUNTER — Inpatient Hospital Stay: Attending: Genetic Counselor | Admitting: Nurse Practitioner

## 2024-02-23 ENCOUNTER — Telehealth: Payer: Self-pay

## 2024-02-23 NOTE — Progress Notes (Deleted)
 CLINIC:  Survivorship   REASON FOR VISIT:  Routine follow-up post-treatment for a recent history of breast cancer.  BRIEF ONCOLOGIC HISTORY:  Oncology History  Malignant neoplasm of upper-outer quadrant of left breast in female, estrogen receptor positive (HCC)  07/04/2023 Initial Diagnosis   Malignant neoplasm of upper-outer quadrant of left breast in female, estrogen receptor positive (HCC)   07/04/2023 Cancer Staging   Staging form: Breast, AJCC 8th Edition - Clinical stage from 07/04/2023: Stage IA (cT1b, cN0, cM0, G2, ER+, PR+, HER2-) - Signed by Izell Domino, MD on 07/04/2023 Stage prefix: Initial diagnosis Histologic grading system: 3 grade system   07/18/2023 Cancer Staging   Staging form: Breast, AJCC 8th Edition - Pathologic: Stage IA (pT1c, pN0, cM0, G2, ER+, PR+, HER2-) - Signed by Lanny Callander, MD on 07/18/2023 Histologic grading system: 3 grade system Residual tumor (R): R0 - None   12/04/2023 Genetic Testing   Negative genetic testing on the CancerNext-Expanded+RNAinsight panel.  The report date is Dec 03, 2023.  The CancerNext-Expanded gene panel offered by [facility] and includes sequencing, rearrangement, and RNA analysis for the following 77 genes: AIP, ALK, APC, ATM, BAP1, BARD1, BMPR1A, BRCA1, BRCA2, BRIP1, CDC73, CDH1, CDK4, CDKN1B, CDKN2A, CEBPA, CHEK2, CTNNA1, DDX41, DICER1, ETV6, FH, FLCN, GATA2, LZTR1, MAX, MBD4, MEN1, MET, MLH1, MSH2, MSH3, MSH6, MUTYH, NF1, NF2, NTHL1, PALB2, PHOX2B, PMS2, POT1, PRKAR1A, PTCH1, PTEN, RAD51C, RAD51D, RB1, RET, RPS20, RUNX1, SDHA, SDHAF2, SDHB, SDHC, SDHD, SMAD4, SMARCA4, SMARCB1, SMARCE1, STK11, SUFU, TMEM127, TP53, TSC1, TSC2, VHL, and WT1 (sequencing and deletion/duplication); AXIN2, CTNNA1, DDX41, EGFR, HOXB13, KIT, MBD4, MITF, MSH3, PDGFRA, POLD1 and POLE (sequencing only); EPCAM and GREM1 (deletion/duplication only). RNA data is routinely analyzed for use in variant interpretation for all genes.      INTERVAL HISTORY:   Ms. Rothschild presents to the Survivorship Clinic today for our initial meeting to review her survivorship care plan detailing her treatment course for breast cancer, as well as monitoring long-term side effects of that treatment, education regarding health maintenance, screening, and overall wellness and health promotion.     Overall, Ms. Arreaga reports feeling quite well since completing her radiation therapy approximately 3 months ago.  She ***    REVIEW OF SYSTEMS:  Review of Systems - Oncology Breast: Denies any new nodularity, masses, tenderness, nipple changes, or nipple discharge.      ONCOLOGY TREATMENT TEAM:  1. Surgeon:  Dr. PIERRETTE at [facility] 2. Medical Oncologist: Dr. PIERRETTE  3. Radiation Oncologist: Dr. PIERRETTE    PAST MEDICAL/SURGICAL HISTORY:  Past Medical History:  Diagnosis Date   Anxiety    Arthritis    low back pain, legs   Asthma    At risk for malignant hyperthermia    [place]   Cancer [facility])    left breast IDC   Depression    Dyslipidemia    Family history of brain cancer    Family history of breast cancer    Family history of colon cancer    Family history of stomach cancer    Hypertension    Past Surgical History:  Procedure Laterality Date   BREAST BIOPSY Left 06/20/2023   US  LT BREAST BX W LOC DEV 1ST LESION IMG BX SPEC US  GUIDE 06/20/2023 [facility] MAMMOGRAPHY   BREAST BIOPSY  07/10/2023   MM LT RADIOACTIVE SEED LOC MAMMO GUIDE 07/10/2023 [facility] MAMMOGRAPHY   BREAST LUMPECTOMY WITH RADIOACTIVE SEED AND SENTINEL LYMPH NODE BIOPSY Left 07/11/2023   Procedure: LEFT BREAST RADIOACTIVE SEED LOCALIZED  LUMPECTOMY AND SENTINEL NODE BIOPSY;  Surgeon: Curvin Deward MOULD, MD;  Location: [facility];  Service: General;  Laterality: Left;  PEC BLOCK   RE-EXCISION OF BREAST LUMPECTOMY Left 07/24/2023   Procedure: RE-EXCISION OF LEFT BREAST SUPERIOR MARGIN;  Surgeon: Curvin Deward MOULD, MD;  Location: [facility];  Service: General;   Laterality: Left;   WISDOM TOOTH EXTRACTION       ALLERGIES:  Allergies  Allergen Reactions   Sevoflurane     Concern for MH due to family history      CURRENT MEDICATIONS:  Outpatient Encounter Medications as of 02/23/2024  Medication Sig   albuterol (VENTOLIN HFA) 108 (90 Base) MCG/ACT inhaler 2 puffs every 6 (six) hours as needed for wheezing.   ALPRAZolam  (XANAX ) 0.5 MG tablet TAKE 1/2 TO 1 (ONE-HALF TO ONE) TABLET BY MOUTH AT BEDTIME AS NEEDED FOR ANXIETY   Diclofenac Sodium 3 % GEL Apply 1 Application topically 2 (two) times daily.   FEROSUL 325 (65 Fe) MG tablet Take 325 mg by mouth every other day.   gabapentin  (NEURONTIN ) 100 MG capsule Take 1 capsule (100 mg total) by mouth 3 (three) times daily.   ibuprofen  (ADVIL ,MOTRIN ) 800 MG tablet Take 1 tablet (800 mg total) by mouth 3 (three) times daily.   lisinopril-hydrochlorothiazide (ZESTORETIC) 20-25 MG tablet Take 1 tablet by mouth daily.   meloxicam (MOBIC) 15 MG tablet Take 15 mg by mouth as needed.   oxyCODONE  (ROXICODONE ) 5 MG immediate release tablet Take 1 tablet (5 mg total) by mouth every 6 (six) hours as needed for severe pain (pain score 7-10).   oxyCODONE  (ROXICODONE ) 5 MG immediate release tablet Take 1 tablet (5 mg total) by mouth every 6 (six) hours as needed for severe pain (pain score 7-10).   polyethylene glycol powder (GLYCOLAX /MIRALAX ) 17 GM/SCOOP powder Take 17 g by mouth daily.   rOPINIRole (REQUIP) 3 MG tablet Take 3 mg by mouth at bedtime.   tamoxifen  (NOLVADEX ) 20 MG tablet Take 1 tablet (20 mg total) by mouth daily.   traMADol (ULTRAM) 50 MG tablet Take 50 mg by mouth as needed.   venlafaxine  XR (EFFEXOR  XR) 75 MG 24 hr capsule Take 1 capsule (75 mg total) by mouth daily.   WEGOVY 0.25 MG/0.5ML SOAJ Inject 0.25 mg into the skin once a week.   No facility-administered encounter medications on file as of 02/23/2024.     ONCOLOGIC FAMILY HISTORY:  Family History  Problem Relation Age of Onset    Hypertension Mother    Breast cancer Mother 24   Gastric cancer Father    Asthma Father    Leukemia Father    Cervical cancer Sister    Breast cancer Maternal Aunt 20   Breast cancer Paternal Aunt        dx. 30s   Dementia Maternal Grandmother    Lung disease Maternal Grandfather        black lung   Dementia Paternal Grandmother    Colon cancer Paternal Grandfather        d. 27s   Breast cancer Cousin 50       pat first cousin   Brain cancer Cousin        pat first cousin     GENETIC COUNSELING/TESTING: ***  SOCIAL HISTORY:  Aisling E Emme is /single/married/divorced/widowed/separated and lives alone/with her spouse/family/friend in (city), [place] .  She has (#) children and they live in (city).  Ms. Befort is currently retired/disabled/working part-time/full-time as ***.  She denies any current or history of tobacco, alcohol, or illicit drug use.     PHYSICAL EXAMINATION:  Vital Signs:  There were no vitals filed for this visit. There were no vitals filed for this visit. General: Well-nourished, well-appearing female in no acute distress.  She is unaccompanied/accompanied in clinic by her ***** today.   HEENT: Head is normocephalic.  Pupils equal and reactive to light. Conjunctivae clear without exudate.  Sclerae anicteric. Oral mucosa is pink, moist.  Oropharynx is pink without lesions or erythema.  Lymph: No cervical, supraclavicular, or infraclavicular lymphadenopathy noted on palpation.  Cardiovascular: Regular rate and rhythm.SABRA Respiratory: Clear to auscultation bilaterally. Chest expansion symmetric; breathing non-labored.  GI: Abdomen soft and round; non-tender, non-distended. Bowel sounds normoactive.  GU: Deferred.  Neuro: No focal deficits. Steady gait.  Psych: Mood and affect normal and appropriate for situation.  Extremities: No edema. MSK: No focal spinal tenderness to palpation.  Full range of motion in bilateral upper extremities Skin: Warm and  dry.  LABORATORY DATA:  None for this visit.  DIAGNOSTIC IMAGING:  None for this visit.      ASSESSMENT AND PLAN:  Ms.. Goldinger is a pleasant 53 y.o. female with Stage *** right/left breast invasive ductal carcinoma, ER+/PR+/HER2-, diagnosed in (date), treated with lumpectomy, adjuvant radiation therapy, and anti-estrogen therapy with *** beginning in (date).  She presents to the Survivorship Clinic for our initial meeting and routine follow-up post-completion of treatment for breast cancer.    1. Stage *** right/left breast cancer:  Ms. Mehlhoff is continuing to recover from definitive treatment for breast cancer. She will follow-up with her medical oncologist, Dr. Reine in (month) /2017 with history and physical exam per surveillance protocol.  She will continue her anti-estrogen therapy with (drug). Thus far, she is tolerating the *** well, with minimal side effects. She was instructed to make Dr. Gudena or myself aware if she begins to experience any worsening side effects of the medication and I could see her back in clinic to help manage those side effects, as needed. Though the incidence is low, there is an associated risk of endometrial cancer with anti-estrogen therapies like Tamoxifen .  Ms. Cherry was encouraged to contact Dr. Dorethia or myself with any vaginal bleeding while taking Tamoxifen . Other side effects of Tamoxifen  were again reviewed with her as well. Today, a comprehensive survivorship care plan and treatment summary was reviewed with the patient today detailing her breast cancer diagnosis, treatment course, potential late/long-term effects of treatment, appropriate follow-up care with recommendations for the future, and patient education resources.  A copy of this summary, along with a letter will be sent to the patient's primary care provider via mail/fax/In Basket message after today's visit.    #. Problem(s) at Visit______________  #. Bone health:   Given Ms. Moulin's age/history of breast cancer and her current treatment regimen including anti-estrogen therapy with _______, she is at risk for bone demineralization.  Her last DEXA scan was **/**/20**, which showed (results).***  In the meantime, she was encouraged to increase her consumption of foods rich in calcium, as well as increase her weight-bearing activities.  She was given education on specific activities to promote bone health.  #. Cancer screening:  Due to Ms. Tellado's history and her age, she should receive screening for skin cancers, colon cancer, and gynecologic cancers.  The information and recommendations are listed on the patient's comprehensive care plan/treatment summary and were reviewed in detail with the patient.    #. Health  maintenance and wellness promotion: Ms. Woznick was encouraged to consume 5-7 servings of fruits and vegetables per day. We reviewed the Nutrition Rainbow handout, as well as the handout Take Control of Your Health and Reduce Your Cancer Risk from the American Cancer Society.  She was also encouraged to engage in moderate to vigorous exercise for 30 minutes per day most days of the week. We discussed the [employer] fitness program, which is designed for cancer survivors to help them become more physically fit after cancer treatments.  She was instructed to limit her alcohol consumption and continue to abstain from tobacco use/***was encouraged stop smoking.     #. Support services/counseling: It is not uncommon for this period of the patient's cancer care trajectory to be one of many emotions and stressors.  We discussed an opportunity for her to participate in the next session of FYNN (Finding Your New Normal) support group series designed for patients after they have completed treatment.   Ms. Wieczorek was encouraged to take advantage of our many other support services programs, support groups, and/or counseling in coping with her new life as a  cancer survivor after completing anti-cancer treatment.  She was offered support today through active listening and expressive supportive counseling.  She was given information regarding our available services and encouraged to contact me with any questions or for help enrolling in any of our support group/programs.    Dispo:   -Return to cancer center ***  -Mammogram due in *** -Follow up with surgery *** -She is welcome to return back to the Survivorship Clinic at any time; no additional follow-up needed at this time.  -Consider referral back to survivorship as a long-term survivor for continued surveillance  A total of (30) minutes of face-to-face time was spent with this patient with greater than 50% of that time in counseling and care-coordination.   Anjeli Casad, NP Survivorship Program [facility] [phone]   Note: PRIMARY CARE PROVIDER Leonel Cole, MD [phone]

## 2024-02-23 NOTE — Telephone Encounter (Signed)
 Called patient due to not showing up for her 900 app with Lacie Burton NP. Patient stated she is not able to come in for appointments at this time due to caring for her father who has a feeding tube now and she is his primary care giver. She stated she would contact us  if she has any needs.

## 2024-02-26 ENCOUNTER — Other Ambulatory Visit: Payer: Self-pay | Admitting: Nurse Practitioner

## 2024-02-26 DIAGNOSIS — Z170 Estrogen receptor positive status [ER+]: Secondary | ICD-10-CM

## 2024-02-29 NOTE — Progress Notes (Unsigned)
 Patient Care Team: Leonel Cole, MD as PCP - General (Family Medicine) Alvera Reagin, [place] (Physician Assistant) Glean Stephane BROCKS, RN (Inactive) as Oncology Nurse Navigator Tyree Nanetta SAILOR, RN as Oncology Nurse Navigator Lanny Callander, MD as Consulting Physician (Hematology) Burton, Lacie K, NP as Nurse Practitioner (Nurse Practitioner) Curvin Deward MOULD, MD as Consulting Physician (General Surgery)  Clinic Day:  03/01/2024  Referring physician: Leonel Cole, MD  I connected with Vanessa Carr on 03/01/24 at  8:30 AM EDT by telephone and verified that I am speaking with the correct person using two identifiers.   I discussed the limitations, risks, security and privacy concerns of performing an evaluation and management service by telemedicine and the availability of in-person appointments. I also discussed with the patient that there may be a patient responsible charge related to this service. The patient expressed understanding and agreed to proceed.   Other persons participating in the visit and their role in the encounter: none    Patient's location: home   Provider's location: [facility]    Chief Complaint: Left breast cancer, ER +  ASSESSMENT & PLAN:   Assessment & Plan: Malignant neoplasm of upper-outer quadrant of left breast in female, estrogen receptor positive (HCC) pT1cN0M0, stage IA, ER+/PR+/HER2-, g2 Invasive ductal carcinoma, stage 1A, with high-grade DCIS. Tumor size 1.5 cm, ER/PR strongly positive, HER2 negative. Initial surgery with lymph node removal showed four negative nodes. Positive margin for DCIS in the superior margin necessitating a second surgery.  -Oncotype testing performed with score of 16. A 4% risk of recurrence or metastatic disease in next 9 years with Tamoxifen  alone.  - Completed radiation therapy on 10/07/2023.  - Start tamoxifen  20 mg daily  --she has been tolerating this well with minimal and manageable side effects. Continue daily.   -3D mammogram will be ordered for 05/2024.  -plan for labs and follow up in 06/2024.    Hot flashes at night She reports the only negative side effect she has experienced from starting tamoxifen  has been hot flashes at night. She denies night sweats or unusual joint pain or myalgias. We discussed topical use of peppermint oil to help reduce the severity of hot flashes.   Breast pain She reports intermittent pain and tightness in the left breast since surgery. She has talked to her surgeon about this. He gave her prescription for gabapentin  to take as needed. She states that this has been helpful and is gradually improving. She was also instructed on massage techniques to help  relieve lymphedema. She has been doing this routinely and has noted good results.   Health maintenance The patient states that she has appointments scheduled in September with her primary care and her GYN for routine exams. I will order her mammogram, which is due 05/2024.   Plan Continue tamoxifen  daily.  3D diagnostic mammogram to be done in 05/2024 and ordered as part of today's visit.  Routine, health maintenance appointments as scheduled.  Plant for labs and follow up in 06/2024.   The patient understands the plans discussed today and is in agreement with them.  She knows to contact our office if she develops concerns prior to her next appointment.  I provided 10 minutes of face-to-face time during this encounter and > 50% was spent counseling as documented under my assessment and plan.    Vanessa FORBES Lessen, NP  [facility] - A DEPT OF [facility] [address] [place] [address] Dept: [phone] Dept Fax: [phone]   Orders Placed This Encounter  Procedures   MM 3D DIAGNOSTIC MAMMOGRAM BILATERAL BREAST    Standing Status:   Future    Expected Date:   05/19/2024    Expiration Date:   03/01/2025    Reason for Exam (SYMPTOM  OR  DIAGNOSIS REQUIRED):   breast cancer follow up    Is the patient pregnant?:   No    Preferred imaging location?:   [facility]      CHIEF COMPLAINT:  CC: Left breast cancer, ER +  Current Treatment: Tamoxifen  20 mg (started by 13 2025)  INTERVAL HISTORY:  Vanessa Carr is here today for repeat clinical assessment.  She was last seen by me on 11/18/2023.  She had finished radiation treatment in 10/2023.  Expected to start tamoxifen  11/18/2023.  She is due for diagnostic mammogram in November 2025.  She states that she has tolerated tamoxifen  well. She has experienced hot flashes, mostly at night. Denies night sweats. Denies unusual joint or muscle pain. She does have intermittent sharp pains in the left breast since surgery. Her surgeon has given her a prescription for gabapentin  to use as needed. She denies changes, lumps, or masses in either breast. She denies chest pain, chest pressure, or shortness of breath. She denies headaches or visual disturbances. She denies abdominal pain, nausea, vomiting, or changes in bowel or bladder habits.  She denies fevers or chills. She denies pain. Her appetite is good. She is currently participating in medically managed weight loss program. She is taking Wegovy as part of this plan. She is also consuming a high protein diet. She states that she has lost nearly 100 pounds since starting the program.   I have reviewed the past medical history, past surgical history, social history and family history with the patient and they are unchanged from previous note.  ALLERGIES:  is allergic to sevoflurane.  MEDICATIONS:  Current Outpatient Medications  Medication Sig Dispense Refill   albuterol (VENTOLIN HFA) 108 (90 Base) MCG/ACT inhaler 2 puffs every 6 (six) hours as needed for wheezing.     ALPRAZolam  (XANAX ) 0.5 MG tablet TAKE 1/2 TO 1 (ONE-HALF TO ONE) TABLET BY MOUTH AT BEDTIME AS NEEDED FOR ANXIETY 30 tablet 0   Diclofenac Sodium 3 % GEL Apply 1 Application  topically 2 (two) times daily.     FEROSUL 325 (65 Fe) MG tablet Take 325 mg by mouth every other day.     gabapentin  (NEURONTIN ) 100 MG capsule Take 1 capsule (100 mg total) by mouth 3 (three) times daily. 90 capsule 2   ibuprofen  (ADVIL ,MOTRIN ) 800 MG tablet Take 1 tablet (800 mg total) by mouth 3 (three) times daily. 21 tablet 0   lisinopril-hydrochlorothiazide (ZESTORETIC) 20-25 MG tablet Take 1 tablet by mouth daily.     meloxicam (MOBIC) 15 MG tablet Take 15 mg by mouth as needed.     oxyCODONE  (ROXICODONE ) 5 MG immediate release tablet Take 1 tablet (5 mg total) by mouth every 6 (six) hours as needed for severe pain (pain score 7-10). 15 tablet 0   oxyCODONE  (ROXICODONE ) 5 MG immediate release tablet Take 1 tablet (5 mg total) by mouth every 6 (six) hours as needed for severe pain (pain score 7-10). 10 tablet 0   polyethylene glycol powder (GLYCOLAX /MIRALAX ) 17 GM/SCOOP powder Take 17 g by mouth daily.     rOPINIRole (REQUIP) 3 MG tablet Take 3 mg by mouth at bedtime.  tamoxifen  (NOLVADEX ) 20 MG tablet Take 1 tablet (20 mg total) by mouth daily. 90 tablet 1   traMADol (ULTRAM) 50 MG tablet Take 50 mg by mouth as needed.     venlafaxine  XR (EFFEXOR -XR) 75 MG 24 hr capsule Take 1 capsule by mouth once daily 30 capsule 0   WEGOVY 0.25 MG/0.5ML SOAJ Inject 0.25 mg into the skin once a week.     No current facility-administered medications for this visit.    HISTORY OF PRESENT ILLNESS:   Oncology History  Malignant neoplasm of upper-outer quadrant of left breast in female, estrogen receptor positive (HCC)  07/04/2023 Initial Diagnosis   Malignant neoplasm of upper-outer quadrant of left breast in female, estrogen receptor positive (HCC)   07/04/2023 Cancer Staging   Staging form: Breast, AJCC 8th Edition - Clinical stage from 07/04/2023: Stage IA (cT1b, cN0, cM0, G2, ER+, PR+, HER2-) - Signed by Izell Domino, MD on 07/04/2023 Stage prefix: Initial diagnosis Histologic grading  system: 3 grade system   07/18/2023 Cancer Staging   Staging form: Breast, AJCC 8th Edition - Pathologic: Stage IA (pT1c, pN0, cM0, G2, ER+, PR+, HER2-) - Signed by Lanny Callander, MD on 07/18/2023 Histologic grading system: 3 grade system Residual tumor (R): R0 - None   12/04/2023 Genetic Testing   Negative genetic testing on the CancerNext-Expanded+RNAinsight panel.  The report date is Dec 03, 2023.  The CancerNext-Expanded gene panel offered by [facility] and includes sequencing, rearrangement, and RNA analysis for the following 77 genes: AIP, ALK, APC, ATM, BAP1, BARD1, BMPR1A, BRCA1, BRCA2, BRIP1, CDC73, CDH1, CDK4, CDKN1B, CDKN2A, CEBPA, CHEK2, CTNNA1, DDX41, DICER1, ETV6, FH, FLCN, GATA2, LZTR1, MAX, MBD4, MEN1, MET, MLH1, MSH2, MSH3, MSH6, MUTYH, NF1, NF2, NTHL1, PALB2, PHOX2B, PMS2, POT1, PRKAR1A, PTCH1, PTEN, RAD51C, RAD51D, RB1, RET, RPS20, RUNX1, SDHA, SDHAF2, SDHB, SDHC, SDHD, SMAD4, SMARCA4, SMARCB1, SMARCE1, STK11, SUFU, TMEM127, TP53, TSC1, TSC2, VHL, and WT1 (sequencing and deletion/duplication); AXIN2, CTNNA1, DDX41, EGFR, HOXB13, KIT, MBD4, MITF, MSH3, PDGFRA, POLD1 and POLE (sequencing only); EPCAM and GREM1 (deletion/duplication only). RNA data is routinely analyzed for use in variant interpretation for all genes.        REVIEW OF SYSTEMS:   Constitutional: Denies fevers, chills or abnormal weight loss Eyes: Denies blurriness of vision Ears, nose, mouth, throat, and face: Denies mucositis or sore throat Respiratory: Denies cough, dyspnea or wheezes Cardiovascular: Denies palpitation, chest discomfort or lower extremity swelling Gastrointestinal:  Denies nausea, heartburn or change in bowel habits Skin: Denies abnormal skin rashes Lymphatics: Denies new lymphadenopathy or easy bruising Neurological:Denies numbness, tingling or new weaknesses Behavioral/Psych: Mood is stable, no new changes  All other systems were reviewed with the patient and are negative.   VITALS:   There were no vitals taken for this visit.  Wt Readings from Last 3 Encounters:  11/18/23 236 lb 11.2 oz (107.4 kg)  08/25/23 238 lb 4 oz (108.1 kg)  07/24/23 240 lb 8.4 oz (109.1 kg)    There is no height or weight on file to calculate BMI.  Performance status (ECOG): 1 - Symptomatic but completely ambulatory  LABORATORY DATA:  I have reviewed the data as listed    Component Value Date/Time   NA 138 11/18/2023 1204   K 3.6 11/18/2023 1204   CL 102 11/18/2023 1204   CO2 31 11/18/2023 1204   GLUCOSE 82 11/18/2023 1204   BUN 14 11/18/2023 1204   CREATININE 0.61 11/18/2023 1204   CALCIUM 9.0 11/18/2023 1204   PROT 7.0 11/18/2023 1204  ALBUMIN 4.0 11/18/2023 1204   AST 15 11/18/2023 1204   ALT 10 11/18/2023 1204   ALKPHOS 52 11/18/2023 1204   BILITOT 0.4 11/18/2023 1204   GFRNONAA >60 11/18/2023 1204   GFRAA >60 09/08/2015 1411    Lab Results  Component Value Date   WBC 7.1 11/18/2023   NEUTROABS 4.7 11/18/2023   HGB 12.9 11/18/2023   HCT 38.0 11/18/2023   MCV 82.3 11/18/2023   PLT 190 11/18/2023

## 2024-02-29 NOTE — Assessment & Plan Note (Signed)
 [id number], stage IA, ER+/PR+/HER2-, g2 Invasive ductal carcinoma, stage 1A, with high-grade DCIS. Tumor size 1.5 cm, ER/PR strongly positive, HER2 negative. Initial surgery with lymph node removal showed four negative nodes. Positive margin for DCIS in the superior margin necessitating a second surgery.  -Oncotype testing performed with score of 16. A 4% risk of recurrence or metastatic disease in next 9 years with Tamoxifen alone.  - Completed radiation therapy on 10/07/2023.  - Genetics appointment today.  - Start tamoxifen 20 mg daily  - labs and follow up 1 month.

## 2024-03-01 ENCOUNTER — Encounter: Payer: Self-pay | Admitting: Nurse Practitioner

## 2024-03-01 ENCOUNTER — Inpatient Hospital Stay: Attending: Genetic Counselor | Admitting: Nurse Practitioner

## 2024-03-01 DIAGNOSIS — C50412 Malignant neoplasm of upper-outer quadrant of left female breast: Secondary | ICD-10-CM | POA: Diagnosis not present

## 2024-03-01 DIAGNOSIS — Z170 Estrogen receptor positive status [ER+]: Secondary | ICD-10-CM | POA: Diagnosis not present

## 2024-03-01 MED ORDER — TAMOXIFEN CITRATE 20 MG PO TABS: 20.0000 mg | ORAL_TABLET | Freq: Every day | ORAL | 1 refills | Status: AC

## 2024-03-29 ENCOUNTER — Other Ambulatory Visit: Payer: Self-pay | Admitting: Nurse Practitioner

## 2024-03-29 DIAGNOSIS — Z170 Estrogen receptor positive status [ER+]: Secondary | ICD-10-CM

## 2024-05-19 ENCOUNTER — Encounter

## 2024-08-06 ENCOUNTER — Other Ambulatory Visit: Payer: Self-pay | Admitting: Nurse Practitioner

## 2024-08-06 ENCOUNTER — Ambulatory Visit
Admission: RE | Admit: 2024-08-06 | Discharge: 2024-08-06 | Disposition: A | Source: Ambulatory Visit | Attending: Nurse Practitioner

## 2024-08-06 DIAGNOSIS — C50412 Malignant neoplasm of upper-outer quadrant of left female breast: Secondary | ICD-10-CM

## 2024-08-09 ENCOUNTER — Encounter
